# Patient Record
Sex: Female | Born: 1955 | Race: White | Hispanic: No | Marital: Married | Smoking: Former smoker
Health system: Southern US, Community
[De-identification: ages and names within clinical notes are randomized; demographics above are authoritative.]

## PROBLEM LIST (undated history)

## (undated) DIAGNOSIS — M1990 Unspecified osteoarthritis, unspecified site: Secondary | ICD-10-CM

## (undated) DIAGNOSIS — E871 Hypo-osmolality and hyponatremia: Secondary | ICD-10-CM

## (undated) DIAGNOSIS — I10 Essential (primary) hypertension: Secondary | ICD-10-CM

## (undated) DIAGNOSIS — E538 Deficiency of other specified B group vitamins: Secondary | ICD-10-CM

## (undated) DIAGNOSIS — I6529 Occlusion and stenosis of unspecified carotid artery: Secondary | ICD-10-CM

## (undated) DIAGNOSIS — I739 Peripheral vascular disease, unspecified: Secondary | ICD-10-CM

## (undated) DIAGNOSIS — K219 Gastro-esophageal reflux disease without esophagitis: Secondary | ICD-10-CM

## (undated) DIAGNOSIS — R0600 Dyspnea, unspecified: Secondary | ICD-10-CM

## (undated) DIAGNOSIS — D751 Secondary polycythemia: Secondary | ICD-10-CM

## (undated) DIAGNOSIS — Z87891 Personal history of nicotine dependence: Secondary | ICD-10-CM

## (undated) DIAGNOSIS — E785 Hyperlipidemia, unspecified: Secondary | ICD-10-CM

## (undated) DIAGNOSIS — M810 Age-related osteoporosis without current pathological fracture: Secondary | ICD-10-CM

## (undated) HISTORY — DX: Occlusion and stenosis of unspecified carotid artery: I65.29

## (undated) HISTORY — DX: Essential (primary) hypertension: I10

## (undated) HISTORY — DX: Personal history of nicotine dependence: Z87.891

## (undated) HISTORY — DX: Unspecified osteoarthritis, unspecified site: M19.90

## (undated) HISTORY — DX: Hyperlipidemia, unspecified: E78.5

## (undated) HISTORY — DX: Hypo-osmolality and hyponatremia: E87.1

## (undated) HISTORY — DX: Secondary polycythemia: D75.1

## (undated) HISTORY — DX: Deficiency of other specified B group vitamins: E53.8

## (undated) HISTORY — DX: Age-related osteoporosis without current pathological fracture: M81.0

## (undated) HISTORY — DX: Gastro-esophageal reflux disease without esophagitis: K21.9

---

## 2000-07-25 ENCOUNTER — Other Ambulatory Visit: Admission: RE | Admit: 2000-07-25 | Discharge: 2000-07-25 | Payer: Self-pay | Admitting: *Deleted

## 2012-04-21 ENCOUNTER — Emergency Department: Payer: Self-pay | Admitting: Internal Medicine

## 2017-12-28 DIAGNOSIS — M130 Polyarthritis, unspecified: Secondary | ICD-10-CM | POA: Diagnosis not present

## 2017-12-28 DIAGNOSIS — I10 Essential (primary) hypertension: Secondary | ICD-10-CM | POA: Diagnosis not present

## 2017-12-28 DIAGNOSIS — D751 Secondary polycythemia: Secondary | ICD-10-CM | POA: Diagnosis not present

## 2017-12-28 DIAGNOSIS — F17200 Nicotine dependence, unspecified, uncomplicated: Secondary | ICD-10-CM | POA: Diagnosis not present

## 2018-04-01 DIAGNOSIS — I6523 Occlusion and stenosis of bilateral carotid arteries: Secondary | ICD-10-CM | POA: Diagnosis not present

## 2018-04-01 DIAGNOSIS — I10 Essential (primary) hypertension: Secondary | ICD-10-CM | POA: Diagnosis not present

## 2018-04-01 DIAGNOSIS — Z23 Encounter for immunization: Secondary | ICD-10-CM | POA: Diagnosis not present

## 2018-04-01 DIAGNOSIS — S8012XA Contusion of left lower leg, initial encounter: Secondary | ICD-10-CM | POA: Diagnosis not present

## 2018-04-01 DIAGNOSIS — M810 Age-related osteoporosis without current pathological fracture: Secondary | ICD-10-CM | POA: Diagnosis not present

## 2018-07-05 DIAGNOSIS — M130 Polyarthritis, unspecified: Secondary | ICD-10-CM | POA: Diagnosis not present

## 2018-07-05 DIAGNOSIS — D751 Secondary polycythemia: Secondary | ICD-10-CM | POA: Diagnosis not present

## 2018-07-05 DIAGNOSIS — I10 Essential (primary) hypertension: Secondary | ICD-10-CM | POA: Diagnosis not present

## 2018-07-05 DIAGNOSIS — F17200 Nicotine dependence, unspecified, uncomplicated: Secondary | ICD-10-CM | POA: Diagnosis not present

## 2019-02-06 DIAGNOSIS — M130 Polyarthritis, unspecified: Secondary | ICD-10-CM | POA: Diagnosis not present

## 2019-02-06 DIAGNOSIS — Z1331 Encounter for screening for depression: Secondary | ICD-10-CM | POA: Diagnosis not present

## 2019-02-06 DIAGNOSIS — Z87891 Personal history of nicotine dependence: Secondary | ICD-10-CM | POA: Diagnosis not present

## 2019-02-06 DIAGNOSIS — D751 Secondary polycythemia: Secondary | ICD-10-CM | POA: Diagnosis not present

## 2019-02-06 DIAGNOSIS — Z23 Encounter for immunization: Secondary | ICD-10-CM | POA: Diagnosis not present

## 2019-02-06 DIAGNOSIS — I10 Essential (primary) hypertension: Secondary | ICD-10-CM | POA: Diagnosis not present

## 2019-03-14 DIAGNOSIS — I10 Essential (primary) hypertension: Secondary | ICD-10-CM | POA: Diagnosis not present

## 2019-03-14 DIAGNOSIS — Z1231 Encounter for screening mammogram for malignant neoplasm of breast: Secondary | ICD-10-CM | POA: Diagnosis not present

## 2019-03-14 DIAGNOSIS — Z6821 Body mass index (BMI) 21.0-21.9, adult: Secondary | ICD-10-CM | POA: Diagnosis not present

## 2019-03-14 DIAGNOSIS — E871 Hypo-osmolality and hyponatremia: Secondary | ICD-10-CM | POA: Diagnosis not present

## 2019-04-17 DIAGNOSIS — I10 Essential (primary) hypertension: Secondary | ICD-10-CM | POA: Diagnosis not present

## 2019-04-17 DIAGNOSIS — Z6821 Body mass index (BMI) 21.0-21.9, adult: Secondary | ICD-10-CM | POA: Diagnosis not present

## 2019-04-17 DIAGNOSIS — E871 Hypo-osmolality and hyponatremia: Secondary | ICD-10-CM | POA: Diagnosis not present

## 2019-10-24 DIAGNOSIS — M130 Polyarthritis, unspecified: Secondary | ICD-10-CM | POA: Diagnosis not present

## 2019-10-24 DIAGNOSIS — D751 Secondary polycythemia: Secondary | ICD-10-CM | POA: Diagnosis not present

## 2019-10-24 DIAGNOSIS — Z87891 Personal history of nicotine dependence: Secondary | ICD-10-CM | POA: Diagnosis not present

## 2019-10-24 DIAGNOSIS — I10 Essential (primary) hypertension: Secondary | ICD-10-CM | POA: Diagnosis not present

## 2019-11-13 DIAGNOSIS — Z1231 Encounter for screening mammogram for malignant neoplasm of breast: Secondary | ICD-10-CM | POA: Diagnosis not present

## 2019-11-27 DIAGNOSIS — I10 Essential (primary) hypertension: Secondary | ICD-10-CM | POA: Diagnosis not present

## 2019-11-27 DIAGNOSIS — Z6820 Body mass index (BMI) 20.0-20.9, adult: Secondary | ICD-10-CM | POA: Diagnosis not present

## 2019-11-27 DIAGNOSIS — E871 Hypo-osmolality and hyponatremia: Secondary | ICD-10-CM | POA: Diagnosis not present

## 2019-11-27 DIAGNOSIS — B070 Plantar wart: Secondary | ICD-10-CM | POA: Diagnosis not present

## 2020-02-27 DIAGNOSIS — E871 Hypo-osmolality and hyponatremia: Secondary | ICD-10-CM | POA: Diagnosis not present

## 2020-02-27 DIAGNOSIS — Z23 Encounter for immunization: Secondary | ICD-10-CM | POA: Diagnosis not present

## 2020-02-27 DIAGNOSIS — I10 Essential (primary) hypertension: Secondary | ICD-10-CM | POA: Diagnosis not present

## 2020-02-27 DIAGNOSIS — Z1331 Encounter for screening for depression: Secondary | ICD-10-CM | POA: Diagnosis not present

## 2020-02-27 DIAGNOSIS — D751 Secondary polycythemia: Secondary | ICD-10-CM | POA: Diagnosis not present

## 2020-02-27 DIAGNOSIS — M130 Polyarthritis, unspecified: Secondary | ICD-10-CM | POA: Diagnosis not present

## 2021-03-30 ENCOUNTER — Encounter: Payer: Self-pay | Admitting: *Deleted

## 2021-04-06 ENCOUNTER — Other Ambulatory Visit: Payer: Self-pay

## 2021-04-06 ENCOUNTER — Encounter: Payer: Self-pay | Admitting: Vascular Surgery

## 2021-04-06 ENCOUNTER — Ambulatory Visit (INDEPENDENT_AMBULATORY_CARE_PROVIDER_SITE_OTHER): Payer: BC Managed Care – PPO | Admitting: Vascular Surgery

## 2021-04-06 VITALS — BP 138/69 | HR 95 | Temp 98.5°F | Resp 20 | Ht 62.0 in | Wt 133.0 lb

## 2021-04-06 DIAGNOSIS — I739 Peripheral vascular disease, unspecified: Secondary | ICD-10-CM | POA: Diagnosis not present

## 2021-04-06 NOTE — Progress Notes (Signed)
Patient ID: Kathryn Fowler, female   DOB: 1956/03/06, 65 y.o.   MRN: [id number]  Reason for Consult: New Patient (Initial Visit)   Referred by Lonie Peak, PA-C  Subjective:     HPI:  Kathryn Fowler is a 65 y.o. female without history of vascular disease.  She states that she had screening test which demonstrated carotid artery stenosis in the past.  She is a longtime daily smoker.  She also has hyperlipidemia hypertension.  She denies any history of stroke, TIA or amaurosis.  No personal family history of aneurysm disease.  She presents for bilateral lower extremity left greater than right pain which is present with any amount of walking.  She has very short distance claudication and states that she cannot walk to the front of our office from the current room that she is in.  She also has pain in the middle the night which wakes her from sleeping which is characteristically on the forefoot mostly on the left side sometimes the right.  She denies any tissue loss or ulceration.  She does take cilostazol as previously prescribed she takes no antiplatelets or statins.  Past Medical History:  Diagnosis Date   Arthritis    B12 deficiency    Carotid artery occlusion    Esophageal reflux    History of tobacco abuse    Hyperlipidemia    Hypertension    Hyponatremia    Osteoporosis    Polycythemia    Family History  Problem Relation Age of Onset   Cancer Mother    History reviewed. No pertinent surgical history.  Short Social History:  Social History   Tobacco Use   Smoking status: Every Day    Packs/day: 0.50    Types: Cigarettes   Smokeless tobacco: Not on file  Substance Use Topics   Alcohol use: Not on file    No Known Allergies  Current Outpatient Medications  Medication Sig Dispense Refill   cilostazol (PLETAL) 100 MG tablet Take 100 mg by mouth 2 (two) times daily.     ibuprofen (ADVIL) 800 MG tablet Take 800 mg by mouth every 8 (eight) hours as needed.      losartan (COZAAR) 100 MG tablet Take 100 mg by mouth daily.     metoprolol succinate (TOPROL-XL) 100 MG 24 hr tablet Take 100 mg by mouth daily. Take with or immediately following a meal.     No current facility-administered medications for this visit.    Review of Systems  Constitutional:  Constitutional negative. HENT: HENT negative.  Eyes: Eyes negative.  Cardiovascular: Positive for claudication.  GI: Gastrointestinal negative.  Musculoskeletal: Positive for leg pain.  Skin: Skin negative.  Neurological: Neurological negative. Hematologic: Hematologic/lymphatic negative.  Psychiatric: Psychiatric negative.       Objective:  Objective   Vitals:   04/06/21 1431  BP: 138/69  Pulse: 95  Resp: 20  Temp: 98.5 F (36.9 C)  SpO2: 92%  Weight: 133 lb (60.3 kg)  Height: 5\' 2"  (1.575 m)   Body mass index is 24.33 kg/m.  Physical Exam HENT:     Head: Normocephalic.     Nose:     Comments: Wearing a mask Eyes:     Pupils: Pupils are equal, round, and reactive to light.  Neck:     Vascular: Carotid bruit present.  Cardiovascular:     Pulses:          Radial pulses are 2+ on the right side and 2+ on the  left side.       Femoral pulses are 0 on the right side and 0 on the left side.    Comments: Right carotid bruit Pulmonary:     Effort: Pulmonary effort is normal.  Abdominal:     General: Abdomen is flat.     Palpations: Abdomen is soft.  Musculoskeletal:     Comments: Multiple areas of telangiectasias on the left medial thigh  Skin:    General: Skin is warm and dry.     Capillary Refill: Capillary refill takes less than 2 seconds.  Neurological:     General: No focal deficit present.     Mental Status: She is alert.  Psychiatric:        Mood and Affect: Mood normal.        Behavior: Behavior normal.        Thought Content: Thought content normal.        Judgment: Judgment normal.    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  This is likely due to hypercholesterolemia, hypertension and mostly due to smoking.  We discussed the urgent need for smoking cessation.  She will begin to take 81 mg of aspirin daily.  Given the lack of femoral pulses I will order CT angio abdomen pelvis with bilateral lower extremity runoff and given her carotid bruit on the right we will also get a duplex of her carotid arteries.  I will have her follow-up after the studies to discuss options for intervention.  All of her questions were answered satisfactorily today.    Maeola Harman MD [facility]

## 2021-04-07 ENCOUNTER — Other Ambulatory Visit: Payer: Self-pay

## 2021-04-07 DIAGNOSIS — R0989 Other specified symptoms and signs involving the circulatory and respiratory systems: Secondary | ICD-10-CM

## 2021-04-11 ENCOUNTER — Other Ambulatory Visit: Payer: Self-pay

## 2021-04-11 DIAGNOSIS — I739 Peripheral vascular disease, unspecified: Secondary | ICD-10-CM

## 2021-04-14 ENCOUNTER — Other Ambulatory Visit: Payer: Self-pay

## 2021-04-14 ENCOUNTER — Encounter (HOSPITAL_BASED_OUTPATIENT_CLINIC_OR_DEPARTMENT_OTHER): Payer: Self-pay

## 2021-04-14 ENCOUNTER — Ambulatory Visit (HOSPITAL_BASED_OUTPATIENT_CLINIC_OR_DEPARTMENT_OTHER)
Admission: RE | Admit: 2021-04-14 | Discharge: 2021-04-14 | Disposition: A | Discharge status: Home / Self Care | Payer: BC Managed Care – PPO | Source: Ambulatory Visit | Attending: Vascular Surgery | Admitting: Vascular Surgery

## 2021-04-14 DIAGNOSIS — I739 Peripheral vascular disease, unspecified: Secondary | ICD-10-CM | POA: Diagnosis present

## 2021-04-14 LAB — POCT I-STAT CREATININE: Creatinine, Ser: 0.8 mg/dL (ref 0.44–1.00)

## 2021-04-14 MED ORDER — IOHEXOL 350 MG/ML SOLN
100.0000 mL | Freq: Once | INTRAVENOUS | Status: AC | PRN
  Administered 2021-04-14: 100 mL via INTRAVENOUS

## 2021-04-20 ENCOUNTER — Encounter: Payer: Self-pay | Admitting: Vascular Surgery

## 2021-04-20 ENCOUNTER — Ambulatory Visit (HOSPITAL_COMMUNITY)
Admission: RE | Admit: 2021-04-20 | Discharge: 2021-04-20 | Disposition: A | Discharge status: Home / Self Care | Payer: BC Managed Care – PPO | Source: Ambulatory Visit | Attending: Vascular Surgery | Admitting: Vascular Surgery

## 2021-04-20 ENCOUNTER — Other Ambulatory Visit: Payer: Self-pay

## 2021-04-20 ENCOUNTER — Ambulatory Visit: Payer: BC Managed Care – PPO | Admitting: Vascular Surgery

## 2021-04-20 VITALS — BP 177/72 | HR 90 | Temp 98.5°F | Resp 20 | Ht 62.0 in | Wt 133.0 lb

## 2021-04-20 DIAGNOSIS — R0989 Other specified symptoms and signs involving the circulatory and respiratory systems: Secondary | ICD-10-CM | POA: Insufficient documentation

## 2021-04-20 DIAGNOSIS — I739 Peripheral vascular disease, unspecified: Secondary | ICD-10-CM | POA: Diagnosis not present

## 2021-04-20 MED ORDER — OXYCODONE-ACETAMINOPHEN 5-325 MG PO TABS: 1.0000 | ORAL_TABLET | ORAL | 0 refills | Status: DC | PRN

## 2021-04-20 NOTE — Progress Notes (Signed)
Patient ID: Kathryn Fowler, female   DOB: [birth year], 65 y.o.   MRN: [id number]  Reason for Consult: Follow-up   Referred by No ref. provider found  Subjective:     HPI:  Kathryn Fowler is a 65 y.o. female initially saw for bilateral lower extremity left greater than right rest pain.  She continues to smoke due to significant pain particularly at night.  She does not have any tissue loss or ulceration.  She states that she is only able to walk a very short distance due to claudication.  She does not have any history of stroke, TIA or amaurosis and no personal family history of aneurysm disease.  She takes cilostazol and has begun taking aspirin since my last visit.  She states that her pain in the middle the night is so unbearable that she can no longer sleep.  Past Medical History:  Diagnosis Date   Arthritis    B12 deficiency    Carotid artery occlusion    Esophageal reflux    History of tobacco abuse    Hyperlipidemia    Hypertension    Hyponatremia    Osteoporosis    Polycythemia    Family History  Problem Relation Age of Onset   Cancer Mother    History reviewed. No pertinent surgical history.  Short Social History:  Social History   Tobacco Use   Smoking status: Every Day    Packs/day: 0.25    Types: Cigarettes   Smokeless tobacco: Not on file  Substance Use Topics   Alcohol use: Not on file    No Known Allergies  Current Outpatient Medications  Medication Sig Dispense Refill   cilostazol (PLETAL) 100 MG tablet Take 100 mg by mouth 2 (two) times daily.     ibuprofen (ADVIL) 800 MG tablet Take 800 mg by mouth every 8 (eight) hours as needed.     losartan (COZAAR) 100 MG tablet Take 100 mg by mouth daily.     metoprolol succinate (TOPROL-XL) 100 MG 24 hr tablet Take 100 mg by mouth daily. Take with or immediately following a meal.     oxyCODONE-acetaminophen (PERCOCET/ROXICET) 5-325 MG tablet Take 1 tablet by mouth every 4 (four) hours as needed for severe  pain. 30 tablet 0   No current facility-administered medications for this visit.    Review of Systems  Constitutional:  Constitutional negative. HENT: HENT negative.  Eyes: Eyes negative.  Respiratory: Respiratory negative.  Cardiovascular: Cardiovascular negative.  GI: Gastrointestinal negative.  Musculoskeletal: Positive for leg pain.  Skin: Skin negative.  Neurological: Neurological negative. Hematologic: Hematologic/lymphatic negative.  Psychiatric: Psychiatric negative.       Objective:  Objective   Vitals:   04/20/21 1520  BP: (!) 177/72  Pulse: 90  Resp: 20  Temp: 98.5 F (36.9 C)  SpO2: 93%  Weight: 133 lb (60.3 kg)  Height:  (1.575 m)   Body mass index is 24.33 kg/m.  Physical Exam HENT:     Head: Normocephalic.     Nose:     Comments: Wearing a mask Eyes:     Pupils: Pupils are equal, round, and reactive to light.  Cardiovascular:     Pulses:          Femoral pulses are 0 on the right side and 0 on the left side.      Popliteal pulses are 0 on the right side and 0 on the left side.  Pulmonary:     Effort: Pulmonary effort  is normal.  Abdominal:     General: Abdomen is flat.     Palpations: Abdomen is soft. There is no mass.  Musculoskeletal:     Cervical back: Normal range of motion.     Right lower leg: No edema.     Left lower leg: No edema.  Neurological:     General: No focal deficit present.     Mental Status: She is alert.  Psychiatric:        Mood and Affect: Mood normal.        Behavior: Behavior normal.    Data: CTA IMPRESSION: 1. Bilateral proximal common iliac artery stenoses of possible hemodynamic significance. 2. Distal RIGHT SFA and popliteal tandem stenoses, with diseased 2 vessel tibial runoff. 3. LEFT popliteal artery occlusive disease with limited evaluation of distal tibial runoff 4.  Aortic Atherosclerosis (ICD10-170.0). 5. Small hiatal hernia       Right Carotid Findings:   +----------+--------+--------+--------+-------------------------+---------+               PSV cm/s EDV cm/s Stenosis Plaque Description        Comments     +----------+--------+--------+--------+-------------------------+---------+    CCA Prox   151      12                                                       +----------+--------+--------+--------+-------------------------+---------+    CCA Mid    159      20                heterogenous and  calcific Shadowing   +----------+--------+--------+--------+-------------------------+---------+    CCA Distal 154      17                heterogenous and  calcific Shadowing   +----------+--------+--------+--------+-------------------------+---------+    ICA Prox   94       15       1-39%    heterogenous and calcific              +----------+--------+--------+--------+-------------------------+---------+    ICA Mid    118      23                                                       +----------+--------+--------+--------+-------------------------+---------+    ICA Distal 116      21                                                       +----------+--------+--------+--------+-------------------------+---------+    ECA        173      8                 heterogenous and calcific              +----------+--------+--------+--------+-------------------------+---------+    +----------+--------+-------+----------------+-------------------+              PSV cm/s EDV cms Describe         Arm  Pressure (mmHG)   +----------+--------+-------+----------------+-------------------+   Subclavian 223              Multiphasic, WNL                       +----------+--------+-------+----------------+-------------------+   +---------+--------+--+--------+--+---------+   Vertebral PSV cm/s 88 EDV cm/s 12 Antegrade   +---------+--------+--+--------+--+---------+       Left Carotid Findings:   +----------+--------+--------+--------+------------------+--------+              PSV cm/s EDV cm/s Stenosis Plaque Description Comments   +----------+--------+--------+--------+------------------+--------+   CCA Prox   132      15                                              +----------+--------+--------+--------+------------------+--------+   CCA Mid    136      17                heterogenous                  +----------+--------+--------+--------+------------------+--------+   CCA Distal 137      17                heterogenous                  +----------+--------+--------+--------+------------------+--------+   ICA Prox   78       22       1-39%    heterogenous                  +----------+--------+--------+--------+------------------+--------+   ICA Mid    80       21                                              +----------+--------+--------+--------+------------------+--------+   ICA Distal 80       23                                   tortuous   +----------+--------+--------+--------+------------------+--------+   ECA        178      3                 heterogenous                  +----------+--------+--------+--------+------------------+--------+   +----------+--------+--------+----------------+-------------------+              PSV cm/s EDV cm/s Describe         Arm Pressure (mmHG)   +----------+--------+--------+----------------+-------------------+   Subclavian 265               Multiphasic, WNL                       +----------+--------+--------+----------------+-------------------+   +---------+--------+--+--------+-+---------+   Vertebral PSV cm/s 74 EDV cm/s 9 Antegrade   +---------+--------+--+--------+-+---------+       Summary:  Right Carotid: Velocities in the right ICA are consistent with a 1-39%  stenosis.                 Non-hemodynamically significant plaque <50% noted in the  CCA.  Left Carotid: Velocities in the left ICA are consistent with a 1-39%   stenosis.   Vertebrals:  Bilateral vertebral arteries demonstrate antegrade flow.  Subclavians: Normal flow hemodynamics were seen in bilateral subclavian               arteries.  Assessment/Plan:     65 year old female with significant aortoiliac occlusive disease as well as common femoral disease and bilateral SFA popliteal disease as well.  She has significantly depressed ABIs left greater than right from previous evaluation and we reviewed her CT scan today which demonstrates heavily calcified aortic bifurcation disease.  I discussed with the patient that given her high degree of symptoms as well as her concomitant common femoral and aortoiliac disease she could be a stenting candidate this may not be successful and would likely be short-term fix and ultimately would likely require aortobifemoral bypass.  Patient is hopeful to get this done now and does not want any temporary fixes.  She has never seen cardiology we will get her evaluated with them.  She will continue aspirin.  I have sent 20 Percocet to her pharmacy to get her through the night until her surgery is scheduled.  I discussed the risk and benefits of aortobifemoral bypass.  Specifically I discussed the risk of death, the risk of a coronary event possibly requiring intervention, the risk of pulmonary insufficiency requiring prolonged intubation, the risk of intra-abdominal complications which could lead to small bowel or colon resection, possible risk of kidney failure up to and including dialysis and likelihood of blood transfusion.  Patient at this time has significant rest pain is hopeful to get surgery demonstrates good understanding in the presence of her husband.     Maeola Harman MD [facility]

## 2021-05-03 ENCOUNTER — Other Ambulatory Visit: Payer: Self-pay | Admitting: Physician Assistant

## 2021-05-03 MED ORDER — OXYCODONE-ACETAMINOPHEN 5-325 MG PO TABS: 1.0000 | ORAL_TABLET | Freq: Four times a day (QID) | ORAL | 0 refills | Status: DC | PRN

## 2021-05-03 NOTE — Progress Notes (Signed)
Pt calling to request pain medication as she cannot get in to cardiology for surgical clearance until 05/23/2021.  Will give Percocet 5/325 one po q6h prn pain #20 no refill for her rest pain but will not be able to get another pain prescription until after surgery.  PDMP reviewed.  Doreatha Massed, [facility] 05/03/2021 2:04 PM

## 2021-05-04 ENCOUNTER — Telehealth: Payer: Self-pay

## 2021-05-04 NOTE — Telephone Encounter (Signed)
Patient called requesting a letter to provide to her job stating she will need to be sitting during her shift due to her leg pains. I advised the letter will have to come from [facility] but Dr. Randie Heinz is out of the office this week. Patient stated she needs the work note today, I reiterated that I was unable to provide the letter due to Dr. Randie Heinz being out of the office. I also advised that I will contact him next week to discuss the letter and will call her next week after I speak with him. Patient voiced her understanding.

## 2021-05-04 NOTE — Telephone Encounter (Signed)
Patient calls today to report swelling in her legs and a weight gain of 30 pounds in 2 weeks.  She is crying and would like "something to help her." Advised that we knew she needed surgery and would schedule it after she had the soonest appt with cardiology. Pain meds sent in yesterday. Asked her to call PCP - she said her doctor is out until next week. Advised her there were other doctors at the PCP office and patient states, "what am I gonna tell them, that I hurt and I need something?" Instructed patient she would need to report to the ED if she could not wait. Patient stated, "I'm not going there - its a waste of my money and they let me sister sit there for 4 hours until she died." Patient would also like a letter for work. Jetta aware and will call patient back.

## 2021-05-23 ENCOUNTER — Encounter: Payer: Self-pay | Admitting: *Deleted

## 2021-05-23 ENCOUNTER — Other Ambulatory Visit: Payer: Self-pay

## 2021-05-23 ENCOUNTER — Ambulatory Visit: Payer: BC Managed Care – PPO | Admitting: Cardiovascular Disease

## 2021-05-23 ENCOUNTER — Encounter: Payer: Self-pay | Admitting: Cardiovascular Disease

## 2021-05-23 VITALS — BP 132/66 | HR 97 | Ht 62.0 in | Wt 129.6 lb

## 2021-05-23 DIAGNOSIS — R079 Chest pain, unspecified: Secondary | ICD-10-CM

## 2021-05-23 DIAGNOSIS — I739 Peripheral vascular disease, unspecified: Secondary | ICD-10-CM

## 2021-05-23 DIAGNOSIS — R011 Cardiac murmur, unspecified: Secondary | ICD-10-CM

## 2021-05-23 NOTE — Progress Notes (Signed)
Cardiology Office Note:    Date:  05/23/2021   ID:  [name], DOB [birth year], MRN [id number]  PCP:  Lonie Peak, PA-C   [facility] Providers Cardiologist:    Vihan Santagata     Referring MD: Lonie Peak, PA-C   Chief Complaint  Patient presents with   carotid artery disease    History of Present Illness:    Kathryn Fowler is a 66 y.o. 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 No pertinent surgical history.  Current Medications: Current Meds  Medication Sig   cilostazol (PLETAL) 100 MG tablet Take 100 mg by mouth 2 (two) times daily.   furosemide (LASIX) 20 MG tablet Take 20 mg by mouth daily.   ibuprofen (ADVIL) 800 MG tablet Take 800 mg by mouth every 8 (eight) hours as needed.   losartan (COZAAR) 100 MG tablet Take 100 mg by mouth daily.   metoprolol succinate (TOPROL-XL) 100 MG 24 hr tablet Take 100 mg by mouth daily. Take with or immediately following a meal.   oxyCODONE-acetaminophen (PERCOCET/ROXICET) 5-325 MG tablet Take 1 tablet by mouth every 6 (six) hours as needed for severe pain.     Allergies:   Patient has no known allergies.   Social History   Socioeconomic History   Marital status: Married    Spouse name: Not on file   Number of children: Not on file   Years of education: Not on file   Highest education level: Not on file  Occupational History   Not on file  Tobacco Use   Smoking status: Every Day    Packs/day: 0.25    Types: Cigarettes   Smokeless  tobacco: Not on file  Vaping Use   Vaping Use: Never used  Substance and Sexual Activity   Alcohol use: Not on file   Drug use: Never   Sexual activity: Not on file  Other Topics Concern   Not on file  Social History Narrative   Not on file   Social Determinants of Health   Financial Resource Strain: Not on file  Food Insecurity: Not on file  Transportation Needs: Not on file  Physical Activity: Not on file  Stress: Not on file  Social Connections: Not on file     Family History: The patient's family history includes Cancer in her mother.  ROS:   Please see the history of present illness.     All other systems reviewed and are negative.  EKGs/Labs/Other Studies Reviewed:    The following studies were reviewed today:   EKG:   Jan. 16, 2023 :  NSR  at 97.   INc. RBBB   Recent Labs: 04/14/2021: Creatinine, Ser 0.80  Recent Lipid Panel No results found for: CHOL, TRIG, HDL, CHOLHDL, VLDL, LDLCALC, LDLDIRECT   Risk Assessment/Calculations:           Physical Exam:  VS:  BP 132/66 (BP Location: Left Arm, Patient Position: Sitting, Cuff Size: Normal)    Pulse 97    Ht 5\' 2"  (1.575 m)    Wt 129 lb 9.6 oz (58.8 kg)    SpO2 96%    BMI 23.70 kg/m     Wt Readings from Last 3 Encounters:  05/23/21 129 lb 9.6 oz (58.8 kg)  04/20/21 133 lb (60.3 kg)  04/06/21 133 lb (60.3 kg)     GEN:  Well nourished, well developed in no acute distress HEENT: Normal NECK: No JVD; No carotid bruits LYMPHATICS: No lymphadenopathy CARDIAC: RRR, n soft systolic murmur  RESPIRATORY:  Clear to auscultation without rales, wheezing or rhonchi  ABDOMEN: Soft, non-tender, non-distended MUSCULOSKELETAL:  No edema; No deformity  SKIN: Warm and dry NEUROLOGIC:  Alert and oriented x 3 PSYCHIATRIC:  Normal affect   ASSESSMENT:    1. PAD (peripheral artery disease) (HCC)   2. Murmur    PLAN:       Peripheral arterial disease: She presents with for preoperative evaluation prior to  having peripheral artery surgery.  She has never had any episodes of chest pain or shortness of breath but is scheduled to have a large intra-abdominal procedure.  We will schedule her for a Lexiscan Myoview study for further evaluation. I have encouraged her to stop smoking completely .   I'm not convinced that she will .  2.  Systolic murmur:   will get an echo for further evaluation of her cardiac murmurm      Shared Decision Making/Informed Consent{  The risks [chest pain, shortness of breath, cardiac arrhythmias, dizziness, blood pressure fluctuations, myocardial infarction, stroke/transient ischemic attack, nausea, vomiting, allergic reaction, radiation exposure, metallic taste sensation and life-threatening complications (estimated to be 1 in 10,000)], benefits (risk stratification, diagnosing coronary artery disease, treatment guidance) and alternatives of a nuclear stress test were discussed in detail with Ms. Mcneece and she agrees to proceed.    Medication Adjustments/Labs and Tests Ordered: Current medicines are reviewed at length with the patient today.  Concerns regarding medicines are outlined above.  Orders Placed This Encounter  Procedures   Myocardial Perfusion Imaging   EKG 12-Lead   ECHOCARDIOGRAM COMPLETE   No orders of the defined types were placed in this encounter.    Patient Instructions  Medication Instructions:  Your physician recommends that you continue on your current medications as directed. Please refer to the Current Medication list given to you today.  *If you need a refill on your cardiac medications before your next appointment, please call your pharmacy*   Lab Work: None ordered If you have labs (blood work) drawn today and your tests are completely normal, you will receive your results only by: MyChart Message (if you have MyChart) OR A paper copy in the mail If you have any lab test that is abnormal or we need to change your treatment, we  will call you to review the results.   Testing/Procedures: Your physician has requested that you have an echocardiogram. Echocardiography is a painless test that uses sound waves to create images of your heart. It provides your doctor with information about the size and shape of your heart and how well your hearts chambers and valves are working. This procedure takes approximately one hour. There are no restrictions for this procedure.   Your physician has requested that you have a lexiscan myoview. For further information please visit Ranae Pila. Please follow instruction sheet, as given.   Follow-Up: At  [facility], you and your health needs are our priority.  As part of our continuing mission to provide you with exceptional heart care, we have created designated Provider Care Teams.  These Care Teams include your primary Cardiologist (physician) and Advanced Practice Providers (APPs -  Physician Assistants and Nurse Practitioners) who all work together to provide you with the care you need, when you need it.  Your next appointment:   1 year(s)  The format for your next appointment:   In Person  Provider:   PA/NP on Dr. Harvie BridgeNahser's care team    Thank you for choosing [facility]!!   [phone]   Other Instructions       Signed, Kristeen MissPhilip Michial Disney, MD  05/23/2021 2:38 PM    [facility]

## 2021-05-23 NOTE — Patient Instructions (Addendum)
Medication Instructions:  Your physician recommends that you continue on your current medications as directed. Please refer to the Current Medication list given to you today.  *If you need a refill on your cardiac medications before your next appointment, please call your pharmacy*   Lab Work: None ordered If you have labs (blood work) drawn today and your tests are completely normal, you will receive your results only by: MyChart Message (if you have MyChart) OR A paper copy in the mail If you have any lab test that is abnormal or we need to change your treatment, we will call you to review the results.   Testing/Procedures: Your physician has requested that you have an echocardiogram. Echocardiography is a painless test that uses sound waves to create images of your heart. It provides your doctor with information about the size and shape of your heart and how well your hearts chambers and valves are working. This procedure takes approximately one hour. There are no restrictions for this procedure.   Your physician has requested that you have a lexiscan myoview. For further information please visit [email or web address] Please follow instruction sheet, as given.   Follow-Up: At [facility], you and your health needs are our priority.  As part of our continuing mission to provide you with exceptional heart care, we have created designated Provider Care Teams.  These Care Teams include your primary Cardiologist (physician) and Advanced Practice Providers (APPs -  Physician Assistants and Nurse Practitioners) who all work together to provide you with the care you need, when you need it.  Your next appointment:   1 year(s)  The format for your next appointment:   In Person  Provider:   PA/NP on Dr. Harvie Bridge care team    Thank you for choosing [facility]!!   [phone]   Other Instructions

## 2021-05-24 NOTE — Addendum Note (Signed)
Addended by: Vesta Mixer on: 05/24/2021 01:45 PM   Modules accepted: Orders

## 2021-05-26 ENCOUNTER — Telehealth: Payer: Self-pay

## 2021-05-26 ENCOUNTER — Other Ambulatory Visit: Payer: Self-pay | Admitting: Physician Assistant

## 2021-05-26 NOTE — Telephone Encounter (Signed)
Patient is requesting pain meds for rest pain.  Per Pleas Koch, she cannot write another Rx until after surgery.  Pt says she is scheduled to have a stress test and will try to stretch out her remaining pain medicine, then go to Tylenol and Ibuprofen.  She will schedule surgery after her stress test.

## 2021-05-30 ENCOUNTER — Telehealth (HOSPITAL_COMMUNITY): Payer: Self-pay | Admitting: *Deleted

## 2021-05-30 NOTE — Telephone Encounter (Signed)
Patient given detailed instructions per Myocardial Perfusion Study Information Sheet for the test on 06/03/21 at 7:15. Patient notified to arrive 15 minutes early and that it is imperative to arrive on time for appointment to keep from having the test rescheduled.  If you need to cancel or reschedule your appointment, please call the office within 24 hours of your appointment. . Patient verbalized understanding.Daneil Dolin

## 2021-06-03 ENCOUNTER — Other Ambulatory Visit: Payer: Self-pay

## 2021-06-03 ENCOUNTER — Ambulatory Visit (HOSPITAL_BASED_OUTPATIENT_CLINIC_OR_DEPARTMENT_OTHER): Payer: BC Managed Care – PPO

## 2021-06-03 ENCOUNTER — Ambulatory Visit (HOSPITAL_COMMUNITY): Payer: BC Managed Care – PPO | Attending: Cardiology

## 2021-06-03 DIAGNOSIS — I739 Peripheral vascular disease, unspecified: Secondary | ICD-10-CM | POA: Diagnosis not present

## 2021-06-03 DIAGNOSIS — R011 Cardiac murmur, unspecified: Secondary | ICD-10-CM

## 2021-06-03 DIAGNOSIS — I5030 Unspecified diastolic (congestive) heart failure: Secondary | ICD-10-CM | POA: Diagnosis not present

## 2021-06-03 LAB — MYOCARDIAL PERFUSION IMAGING
LV dias vol: 53 mL (ref 46–106)
LV sys vol: 17 mL
Nuc Stress EF: 67 %
Peak HR: 110 {beats}/min
Rest HR: 80 {beats}/min
Rest Nuclear Isotope Dose: 10.8 mCi
SDS: 0
SRS: 0
SSS: 0
ST Depression (mm): 0 mm
Stress Nuclear Isotope Dose: 31.4 mCi
TID: 0.81

## 2021-06-03 LAB — ECHOCARDIOGRAM COMPLETE
Area-P 1/2: 3.73 cm2
S' Lateral: 2.3 cm

## 2021-06-03 MED ORDER — TECHNETIUM TC 99M TETROFOSMIN IV KIT
30.7000 | PACK | Freq: Once | INTRAVENOUS | Status: AC | PRN
  Administered 2021-06-03: 31.4 via INTRAVENOUS
  Filled 2021-06-03: qty 31

## 2021-06-03 MED ORDER — TECHNETIUM TC 99M TETROFOSMIN IV KIT
10.6000 | PACK | Freq: Once | INTRAVENOUS | Status: AC | PRN
  Administered 2021-06-03: 10.8 via INTRAVENOUS
  Filled 2021-06-03: qty 11

## 2021-06-03 MED ORDER — REGADENOSON 0.4 MG/5ML IV SOLN
0.4000 mg | Freq: Once | INTRAVENOUS | Status: AC
  Administered 2021-06-03: 0.4 mg via INTRAVENOUS

## 2021-06-06 ENCOUNTER — Other Ambulatory Visit: Payer: Self-pay

## 2021-06-06 DIAGNOSIS — I739 Peripheral vascular disease, unspecified: Secondary | ICD-10-CM

## 2021-06-07 ENCOUNTER — Telehealth: Payer: Self-pay

## 2021-06-07 NOTE — Telephone Encounter (Signed)
Pt called with questions about her upcoming surgery next month. She has not seen or spoken to MD and prefers to do so beforehand. I have given her pre op information and scheduled her to speak with MD via phone visit tomorrow morning. Pt is aware of this and verbalized understanding.

## 2021-06-08 ENCOUNTER — Ambulatory Visit (INDEPENDENT_AMBULATORY_CARE_PROVIDER_SITE_OTHER): Payer: BC Managed Care – PPO | Admitting: Vascular Surgery

## 2021-06-08 ENCOUNTER — Other Ambulatory Visit: Payer: Self-pay

## 2021-06-08 DIAGNOSIS — I739 Peripheral vascular disease, unspecified: Secondary | ICD-10-CM

## 2021-06-08 MED ORDER — OXYCODONE-ACETAMINOPHEN 5-325 MG PO TABS: 1.0000 | ORAL_TABLET | ORAL | 0 refills | Status: DC | PRN

## 2021-06-08 NOTE — H&P (View-Only) (Signed)
° °  ° ° °Virtual Visit via Telephone Note ° °I connected with Kathryn Fowler on 06/08/2021 using the [email or web address] by telephone and verified that I was speaking with the correct person using two identifiers. Patient was located at home and alone. I am located at in the office alone. °  °The limitations of evaluation and management by telemedicine and the availability of in person appointments have been previously discussed with the patient and are documented in the patients chart. The patient expressed understanding and consented to proceed. ° °Chief Complaint: Bilateral foot pain ° °History of Present Illness: °Kathryn Fowler is a 65 y.o. female with history of bilateral lower extremity foot pain.  CT scan demonstrated calcified aorta with distal occlusion.  ABIs previously in [place] were demonstrated to be 0.5 on the right and 0.36 on the left.  She has recently undergone nuclear stress testing.  She states that she still has severe pain in her feet but denies any tissue loss or ulceration.  She is taking half an oxycodone at night to help her sleep.  She otherwise continues to smoke unfortunately but is trying to cut back.  She is taking aspirin daily.  Denies any previous surgeries. ° °Past Medical History:  °Diagnosis Date  ° Arthritis   ° B12 deficiency   ° Carotid artery occlusion   ° Esophageal reflux   ° History of tobacco abuse   ° Hyperlipidemia   ° Hypertension   ° Hyponatremia   ° Osteoporosis   ° Polycythemia   ° ° °No past surgical history on file. ° °Current Meds  °Medication Sig  ° cilostazol (PLETAL) 100 MG tablet Take 100 mg by mouth 2 (two) times daily.  ° furosemide (LASIX) 20 MG tablet Take 20 mg by mouth daily.  ° ibuprofen (ADVIL) 800 MG tablet Take 800 mg by mouth every 8 (eight) hours as needed.  ° losartan (COZAAR) 100 MG tablet Take 100 mg by mouth daily.  ° metoprolol succinate (TOPROL-XL) 100 MG 24 hr tablet Take 100 mg by mouth daily. Take with or immediately following a meal.   ° oxyCODONE-acetaminophen (PERCOCET/ROXICET) 5-325 MG tablet Take 1 tablet by mouth every 6 (six) hours as needed for severe pain.  ° ° °12 system ROS was negative unless otherwise noted in HPI °  °Observations/Objective: °There were no vitals filed for this visit. 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  I again discussed the nature of the procedure being aortobifemoral bypass.  She does have risk of coronary event but has low risk stress test.  She also has risk for prolonged intubation given underlying lung disease with long history of smoking.  We discussed the risk of blood loss the risk of injury to surrounding structures including to her intestines and small risk of death.  Overall she has about a 20% risk of major complication from the surgery.  She demonstrates good understanding.  We expect a 7 to 10-day hospitalization with possible rehab after this. ° °Follow Up Instructions: ° °  °I discussed the assessment and treatment plan with the patient. The patient was provided an opportunity to ask questions and all were answered. The patient agreed with the plan and demonstrated an understanding of the instructions. °  °The patient was advised to call back or seek an in-person evaluation if the symptoms worsen or   if the condition fails to improve as anticipated. ° °I spent 11 minutes with the patient via telephone encounter and in chart review. ° ° °Signed, °Kristen Fromm °[facility] °Office: [phone] ° °06/08/2021, 9:03 AM ° °

## 2021-06-08 NOTE — Progress Notes (Signed)
Virtual Visit via Telephone Note  I connected with Kathryn Fowler on 06/08/2021 using the Doxy[employer]me by telephone and verified that I was speaking with the correct person using two identifiers. Patient was located at home and alone. I am located at in the office alone.   The limitations of evaluation and management by telemedicine and the availability of in person appointments have been previously discussed with the patient and are documented in the patients chart. The patient expressed understanding and consented to proceed.  Chief Complaint: Bilateral foot pain  History of Present Illness: Kathryn Fowler is a 66 y.o. female with history of bilateral lower extremity foot pain.  CT scan demonstrated calcified aorta with distal occlusion.  ABIs previously in [facility] were demonstrated to be 0.5 on the right and 0.36 on the left.  She has recently undergone nuclear stress testing.  She states that she still has severe pain in her feet but denies any tissue loss or ulceration.  She is taking half an oxycodone at night to help her sleep.  She otherwise continues to smoke unfortunately but is trying to cut back.  She is taking aspirin daily.  Denies any previous surgeries.  Past Medical History:  Diagnosis Date   Arthritis    B12 deficiency    Carotid artery occlusion    Esophageal reflux    History of tobacco abuse    Hyperlipidemia    Hypertension    Hyponatremia    Osteoporosis    Polycythemia     No past surgical history on file.  Current Meds  Medication Sig   cilostazol (PLETAL) 100 MG tablet Take 100 mg by mouth 2 (two) times daily.   furosemide (LASIX) 20 MG tablet Take 20 mg by mouth daily.   ibuprofen (ADVIL) 800 MG tablet Take 800 mg by mouth every 8 (eight) hours as needed.   losartan (COZAAR) 100 MG tablet Take 100 mg by mouth daily.   metoprolol succinate (TOPROL-XL) 100 MG 24 hr tablet Take 100 mg by mouth daily. Take with or immediately following a meal.    oxyCODONE-acetaminophen (PERCOCET/ROXICET) 5-325 MG tablet Take 1 tablet by mouth every 6 (six) hours as needed for severe pain.    12 system ROS was negative unless otherwise noted in HPI   Observations/Objective: There were no vitals filed for this visit. 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  I again discussed the nature of the procedure being aortobifemoral bypass.  She does have risk of coronary event but has low risk stress test.  She also has risk for prolonged intubation given underlying lung disease with long history of smoking.  We discussed the risk of blood loss the risk of injury to surrounding structures including to her intestines and small risk of death.  Overall she has about a 20% risk of major complication from the surgery.  She demonstrates good understanding.  We expect a 7 to 10-day hospitalization with possible rehab after this.  Follow Up Instructions:    I discussed the assessment and treatment plan with the patient. The patient was provided an opportunity to ask questions and all were answered. The patient agreed with the plan and demonstrated an understanding of the instructions.   The patient was advised to call back or seek an in-person evaluation if the symptoms worsen or  if the condition fails to improve as anticipated.  I spent 11 minutes with the patient via telephone encounter and in chart review.   Signed, Servando Snare [facility] Office: [phone]  06/08/2021, 9:03 AM

## 2021-06-16 NOTE — Progress Notes (Signed)
Surgical Instructions    Your procedure is scheduled on 06/21/21.  Report to [facility] Main Entrance "A" at 5:30 A.M., then check in with the Admitting office.  Call this number if you have problems the morning of surgery:  [phone]   If you have any questions prior to your surgery date call [phone]: Open Monday-Friday 8am-4pm    Remember:  Do not eat or drink after midnight the night before your surgery      Take these medicines the morning of surgery with A SIP OF WATER:  metoprolol succinate (TOPROL-XL)  oxyCODONE-acetaminophen (PERCOCET/ROXICET) if needed  As of today, STOP taking any (unless otherwise instructed by your surgeon) Aleve, Naproxen, Ibuprofen, Motrin, Advil, Goody's, BC's, all herbal medications, fish oil, and all vitamins.  STOP cilostazol (PLETAL) 5 DAYS PRIOR TO SURGERY.  YOUR LAST DOSE WILL BE 06/15/21.           Do not wear jewelry or makeup Do not wear lotions, powders, perfumes, or deodorant. Do not shave 48 hours prior to surgery.   Do not bring valuables to the hospital. Do not wear nail polish, gel polish, artificial nails, or any other type of covering on natural nails (fingers and toes) If you have artificial nails or gel coating that need to be removed by a nail salon, please have this removed prior to surgery. Artificial nails or gel coating may interfere with anesthesia's ability to adequately monitor your vital signs.  [facility] is not responsible for any belongings or valuables. .   Do NOT Smoke (Tobacco/Vaping)  24 hours prior to your procedure  If you use a CPAP at night, you may bring your mask for your overnight stay.   Contacts, glasses, hearing aids, dentures or partials may not be worn into surgery, please bring cases for these belongings   For patients admitted to the hospital, discharge time will be determined by your treatment team.   Patients discharged the day of surgery will not be allowed to drive home, and someone  needs to stay with them for 24 hours.  NO VISITORS WILL BE ALLOWED IN PRE-OP WHERE PATIENTS ARE PREPPED FOR SURGERY.  ONLY 1 SUPPORT PERSON MAY BE PRESENT IN THE WAITING ROOM WHILE YOU ARE IN SURGERY.  IF YOU ARE TO BE ADMITTED, ONCE YOU ARE IN YOUR ROOM YOU WILL BE ALLOWED TWO (2) VISITORS. 1 (ONE) VISITOR MAY STAY OVERNIGHT BUT MUST ARRIVE TO THE ROOM BY 8pm.  Minor children may have two parents present. Special consideration for safety and communication needs will be reviewed on a case by case basis.  Special instructions:    Oral Hygiene is also important to reduce your risk of infection.  Remember - BRUSH YOUR TEETH THE MORNING OF SURGERY WITH YOUR REGULAR TOOTHPASTE   [place]- Preparing For Surgery  Before surgery, you can play an important role. Because skin is not sterile, your skin needs to be as free of germs as possible. You can reduce the number of germs on your skin by washing with CHG (chlorahexidine gluconate) Soap before surgery.  CHG is an antiseptic cleaner which kills germs and bonds with the skin to continue killing germs even after washing.     Please do not use if you have an allergy to CHG or antibacterial soaps. If your skin becomes reddened/irritated stop using the CHG.  Do not shave (including legs and underarms) for at least 48 hours prior to first CHG shower. It is OK to shave your face.  Please follow these instructions carefully.     Shower the NIGHT BEFORE SURGERY and the MORNING OF SURGERY with CHG Soap.   If you chose to wash your hair, wash your hair first as usual with your normal shampoo. After you shampoo, rinse your hair and body thoroughly to remove the shampoo.  Then [employer] and genitals (private parts) with your normal soap and rinse thoroughly to remove soap.  After that Use CHG Soap as you would any other liquid soap. You can apply CHG directly to the skin and wash gently with a scrungie or a clean washcloth.   Apply the CHG Soap to your body  ONLY FROM THE NECK DOWN.  Do not use on open wounds or open sores. Avoid contact with your eyes, ears, mouth and genitals (private parts). Wash Face and genitals (private parts)  with your normal soap.   Wash thoroughly, paying special attention to the area where your surgery will be performed.  Thoroughly rinse your body with warm water from the neck down.  DO NOT shower/wash with your normal soap after using and rinsing off the CHG Soap.  Pat yourself dry with a CLEAN TOWEL.  Wear CLEAN PAJAMAS to bed the night before surgery  Place CLEAN SHEETS on your bed the night before your surgery  DO NOT SLEEP WITH PETS.   Day of Surgery: Take a shower with CHG soap. Wear Clean/Comfortable clothing the morning of surgery Do not apply any deodorants/lotions.   Remember to brush your teeth WITH YOUR REGULAR TOOTHPASTE.    COVID testing  If you are going to stay overnight or be admitted after your procedure/surgery and require a pre-op COVID test, please follow these instructions after your COVID test   You are not required to quarantine however you are required to wear a well-fitting mask when you are out and around people not in your household.  If your mask becomes wet or soiled, replace with a new one.  Wash your hands often with soap and water for 20 seconds or clean your hands with an alcohol-based hand sanitizer that contains at least 60% alcohol.  Do not share personal items.  Notify your provider: if you are in close contact with someone who has COVID  or if you develop a fever of 100.4 or greater, sneezing, cough, sore throat, shortness of breath or body aches.    Please read over the following fact sheets that you were given.

## 2021-06-17 ENCOUNTER — Encounter (HOSPITAL_COMMUNITY): Payer: Self-pay

## 2021-06-17 ENCOUNTER — Other Ambulatory Visit: Payer: Self-pay

## 2021-06-17 ENCOUNTER — Encounter (HOSPITAL_COMMUNITY)
Admission: RE | Admit: 2021-06-17 | Discharge: 2021-06-17 | Disposition: A | Discharge status: Home / Self Care | Payer: BC Managed Care – PPO | Source: Ambulatory Visit | Attending: Vascular Surgery | Admitting: Vascular Surgery

## 2021-06-17 VITALS — BP 170/80 | HR 109 | Temp 98.4°F | Resp 18 | Ht 62.0 in | Wt 130.9 lb

## 2021-06-17 DIAGNOSIS — Z20822 Contact with and (suspected) exposure to covid-19: Secondary | ICD-10-CM | POA: Diagnosis not present

## 2021-06-17 DIAGNOSIS — F17210 Nicotine dependence, cigarettes, uncomplicated: Secondary | ICD-10-CM | POA: Diagnosis not present

## 2021-06-17 DIAGNOSIS — E785 Hyperlipidemia, unspecified: Secondary | ICD-10-CM | POA: Diagnosis not present

## 2021-06-17 DIAGNOSIS — R0600 Dyspnea, unspecified: Secondary | ICD-10-CM | POA: Diagnosis not present

## 2021-06-17 DIAGNOSIS — Z01812 Encounter for preprocedural laboratory examination: Secondary | ICD-10-CM | POA: Insufficient documentation

## 2021-06-17 DIAGNOSIS — Z0279 Encounter for issue of other medical certificate: Secondary | ICD-10-CM

## 2021-06-17 DIAGNOSIS — Z7902 Long term (current) use of antithrombotics/antiplatelets: Secondary | ICD-10-CM | POA: Diagnosis not present

## 2021-06-17 DIAGNOSIS — D751 Secondary polycythemia: Secondary | ICD-10-CM | POA: Insufficient documentation

## 2021-06-17 DIAGNOSIS — I10 Essential (primary) hypertension: Secondary | ICD-10-CM | POA: Diagnosis not present

## 2021-06-17 DIAGNOSIS — E871 Hypo-osmolality and hyponatremia: Secondary | ICD-10-CM | POA: Insufficient documentation

## 2021-06-17 DIAGNOSIS — J984 Other disorders of lung: Secondary | ICD-10-CM | POA: Insufficient documentation

## 2021-06-17 DIAGNOSIS — I358 Other nonrheumatic aortic valve disorders: Secondary | ICD-10-CM | POA: Insufficient documentation

## 2021-06-17 DIAGNOSIS — I2510 Atherosclerotic heart disease of native coronary artery without angina pectoris: Secondary | ICD-10-CM | POA: Diagnosis not present

## 2021-06-17 DIAGNOSIS — I739 Peripheral vascular disease, unspecified: Secondary | ICD-10-CM | POA: Diagnosis not present

## 2021-06-17 DIAGNOSIS — R011 Cardiac murmur, unspecified: Secondary | ICD-10-CM | POA: Diagnosis not present

## 2021-06-17 DIAGNOSIS — Z7982 Long term (current) use of aspirin: Secondary | ICD-10-CM | POA: Diagnosis not present

## 2021-06-17 DIAGNOSIS — Z01818 Encounter for other preprocedural examination: Secondary | ICD-10-CM

## 2021-06-17 HISTORY — DX: Dyspnea, unspecified: R06.00

## 2021-06-17 HISTORY — DX: Occlusion and stenosis of unspecified carotid artery: I65.29

## 2021-06-17 HISTORY — DX: Peripheral vascular disease, unspecified: I73.9

## 2021-06-17 LAB — COMPREHENSIVE METABOLIC PANEL
ALT: 15 U/L (ref 0–44)
AST: 18 U/L (ref 15–41)
Albumin: 3.9 g/dL (ref 3.5–5.0)
Alkaline Phosphatase: 71 U/L (ref 38–126)
Anion gap: 10 (ref 5–15)
BUN: 7 mg/dL — ABNORMAL LOW (ref 8–23)
CO2: 26 mmol/L (ref 22–32)
Calcium: 9.1 mg/dL (ref 8.9–10.3)
Chloride: 97 mmol/L — ABNORMAL LOW (ref 98–111)
Creatinine, Ser: 0.66 mg/dL (ref 0.44–1.00)
GFR, Estimated: >60 mL/min (ref >60)
Glucose, Bld: 122 mg/dL — ABNORMAL HIGH (ref 70–99)
Potassium: 4.1 mmol/L (ref 3.5–5.1)
Sodium: 133 mmol/L — ABNORMAL LOW (ref 135–145)
Total Bilirubin: 0.7 mg/dL (ref 0.3–1.2)
Total Protein: 6.8 g/dL (ref 6.5–8.1)

## 2021-06-17 LAB — PROTIME-INR
INR: 0.9 (ref 0.8–1.2)
Prothrombin Time: 12.6 seconds (ref 11.4–15.2)

## 2021-06-17 LAB — CBC
HCT: 45.6 % (ref 36.0–46.0)
Hemoglobin: 15.2 g/dL — ABNORMAL HIGH (ref 12.0–15.0)
MCH: 33 pg (ref 26.0–34.0)
MCHC: 33.3 g/dL (ref 30.0–36.0)
MCV: 98.9 fL (ref 80.0–100.0)
Platelets: 336 10*3/uL (ref 150–400)
RBC: 4.61 MIL/uL (ref 3.87–5.11)
RDW: 12.3 % (ref 11.5–15.5)
WBC: 6.1 10*3/uL (ref 4.0–10.5)
nRBC: 0 % (ref 0.0–0.2)

## 2021-06-17 LAB — SURGICAL PCR SCREEN
MRSA, PCR: NEGATIVE
Staphylococcus aureus: POSITIVE — AB

## 2021-06-17 LAB — SARS CORONAVIRUS 2 (TAT 6-24 HRS): SARS Coronavirus 2: NEGATIVE

## 2021-06-17 LAB — URINALYSIS, ROUTINE W REFLEX MICROSCOPIC
Bilirubin Urine: NEGATIVE
Glucose, UA: NEGATIVE mg/dL
Hgb urine dipstick: NEGATIVE
Ketones, ur: NEGATIVE mg/dL
Leukocytes,Ua: NEGATIVE
Nitrite: NEGATIVE
Protein, ur: NEGATIVE mg/dL
Specific Gravity, Urine: 1.013 (ref 1.005–1.030)
pH: 6 (ref 5.0–8.0)

## 2021-06-17 LAB — APTT: aPTT: 27 seconds (ref 24–36)

## 2021-06-17 NOTE — Progress Notes (Signed)
PCP - Cyndi Bender Cardiologist - Dr. Acie Fredrickson (see office note on 05/23/21)  Chest x-ray - n/a EKG - 05/23/21 Stress Test - 06/03/21 ECHO - 06/03/21   Blood Thinner Instructions: Last dose of Pletal on 06/14/21, per patient    COVID TEST- 06/17/21   Anesthesia review: yes, heart history  Patient denies shortness of breath, fever, cough and chest pain at PAT appointment   All instructions explained to the patient, with a verbal understanding of the material. Patient agrees to go over the instructions while at home for a better understanding. Patient also instructed to self quarantine after being tested for COVID-19. The opportunity to ask questions was provided.

## 2021-06-20 ENCOUNTER — Encounter (HOSPITAL_COMMUNITY): Payer: Self-pay | Admitting: Vascular Surgery

## 2021-06-20 ENCOUNTER — Encounter (HOSPITAL_COMMUNITY): Payer: Self-pay

## 2021-06-20 NOTE — Progress Notes (Signed)
Anesthesia Chart Review:  Case: [id number] Date/Time: 06/21/21 0715   Procedure: AORTOBIFEMORAL BYPASS GRAFT   Anesthesia type: General   Pre-op diagnosis: PAD   Location: [facility] OR ROOM 12 / [facility] OR   Surgeons: Maeola Harman, MD       DISCUSSION: Patient is a 66 year old female scheduled for the above procedure.  History includes smoking, HTN, HLD, polycythemia, dyspnea, carotid artery disease, PAD (with BLE claudication), hyponatremia.   She was seen by cardiologist Dr. Elease Hashimoto on 05/23/2021 for preoperative cardiology evaluation.  Denied CV symptoms but given large intra-abdominal procedure a preoperative Lexiscan Myoview study was ordered.  He also ordered an echocardiogram for evaluation of systolic murmur.  06/03/2021 stress test was nonischemic, and echo showed  Normal LV systolic function, grade 1 diastolic dysfunction, mildly dilated LA, trivial MR, aortic valve sclerosis without evidence of aortic stenosis.  She continues to smoke but has been trying to cut back.  Dr. Pascal Lux discussed that she would be at "risk for prolonged intubation given underlying lung disease with long history of smoking."  Per [facility], stop Pletal 5 days prior, continue ASA. She reported last Pletal dose 06/14/21.  06/17/2021 presurgical COVID-19 test negative.  Anesthesia team to evaluate on the day of surgery.   VS: BP (!) 170/80 Comment: manual   Pulse (!) 109    Temp 36.9 C (Oral)    Resp 18    Ht 5\' 2"  (1.575 m)    Wt 59.4 kg    SpO2 96%    BMI 23.94 kg/m    PROVIDERS: , PA-C is PCP  Lonie Peak, MD is cardiologist. Seen once on 05/23/21 for preoperative evaluation.    LABS: Labs reviewed: Acceptable for surgery. (all labs ordered are listed, but only abnormal results are displayed)  Labs Reviewed  SURGICAL PCR SCREEN - Abnormal; Notable for the following components:      Result Value   Staphylococcus aureus POSITIVE (*)    All other components within normal limits  CBC -  Abnormal; Notable for the following components:   Hemoglobin 15.2 (*)    All other components within normal limits  COMPREHENSIVE METABOLIC PANEL - Abnormal; Notable for the following components:   Sodium 133 (*)    Chloride 97 (*)    Glucose, Bld 122 (*)    BUN 7 (*)    All other components within normal limits  SARS CORONAVIRUS 2 (TAT 6-24 HRS)  PROTIME-INR  APTT  URINALYSIS, ROUTINE W REFLEX MICROSCOPIC  TYPE AND SCREEN     IMAGES: CTA Ao+BiFem 04/14/21: IMPRESSION: 1. Bilateral proximal common iliac artery stenoses of possible hemodynamic significance. 2. Distal RIGHT SFA and popliteal tandem stenoses, with diseased 2 vessel tibial runoff. 3. LEFT popliteal artery occlusive disease with limited evaluation of distal tibial runoff 4.  Aortic Atherosclerosis (ICD10-170.0). 5. Small hiatal hernia    EKG: 05/23/2021: Normal sinus rhythm.  Right bundle branch block.   CV: Nuclear stress test 06/03/21:   The study is normal. The study is low risk.   No ST deviation was noted.   LV perfusion is normal. There is no evidence of ischemia. There is no evidence of infarction.   Left ventricular function is normal. Nuclear stress EF: 67 %. The left ventricular ejection fraction is hyperdynamic (>65%). End diastolic cavity size is normal.    Echo 06/03/21: IMPRESSIONS   1. Left ventricular ejection fraction, by estimation, is 60 to 65%. The  left ventricle has normal function. The left ventricle has no  regional  wall motion abnormalities. Left ventricular diastolic parameters are  consistent with Grade I diastolic  dysfunction (impaired relaxation).   2. Right ventricular systolic function is normal. The right ventricular  size is normal.   3. Left atrial size was mildly dilated.   4. Cannot r/o PFO.   5. The mitral valve is abnormal. Trivial mitral valve regurgitation. No  evidence of mitral stenosis.   6. The aortic valve is tricuspid. There is mild calcification of the   aortic valve. Aortic valve regurgitation is not visualized. Aortic valve  sclerosis is present, with no evidence of aortic valve stenosis.   7. The inferior vena cava is normal in size with greater than 50%  respiratory variability, suggesting right atrial pressure of 3 mmHg.    US Carotid 04/20/21: Summary:  - Right Carotid: Velocities in the right ICA are consistent with a 1-39%  stenosis.                 Non-hemodynamically significant plaque <50% noted in the  CCA.  - Left Carotid: Velocities in the left ICA are consistent with a 1-39%  stenosis.  - Vertebrals:  Bilateral vertebral arteries demonstrate antegrade flow.  - Subclavians: Normal flow hemodynamics were seen in bilateral subclavian               arteries.    Past Medical History:  Diagnosis Date   Arthritis    B12 deficiency    Carotid artery stenosis    1-39% BICA stenosis 04/20/21 [place]   Dyspnea    Esophageal reflux    History of tobacco abuse    Hyperlipidemia    Hypertension    Hyponatremia    Osteoporosis    PAD (peripheral artery disease) (HCC)    Polycythemia     History reviewed. No pertinent surgical history.  MEDICATIONS:  cilostazol (PLETAL) 100 MG tablet   furosemide (LASIX) 20 MG tablet   ibuprofen (ADVIL) 800 MG tablet   losartan (COZAAR) 100 MG tablet   metoprolol succinate (TOPROL-XL) 100 MG 24 hr tablet   oxyCODONE-acetaminophen (PERCOCET/ROXICET) 5-325 MG tablet   No current facility-administered medications for this encounter.    Shonna Chock, PA-C Surgical Short Stay/Anesthesiology [facility] Phone [phone] [facility] Phone [phone] 06/20/2021 10:47 AM

## 2021-06-20 NOTE — Anesthesia Preprocedure Evaluation (Addendum)
Anesthesia Evaluation  Patient identified by MRN, date of birth, ID band Patient awake    Reviewed: Allergy & Precautions, NPO status , Patient's Chart, lab work & pertinent test results  History of Anesthesia Complications Negative for: history of anesthetic complications  Airway Mallampati: I  TM Distance: >3 FB Neck ROM: Full    Dental  (+) Edentulous Upper, Dental Advisory Given   Pulmonary Current Smoker and Patient abstained from smoking.,    + rhonchi        Cardiovascular hypertension, + Peripheral Vascular Disease  Normal cardiovascular exam  Nuclear stress test 06/03/21:  The study is normal. The study is low risk.  No ST deviation was noted.  LV perfusion is normal. There is no evidence of ischemia. There is no evidence of infarction.  Left ventricular function is normal. Nuclear stress EF: 67 %. The left ventricular ejection fraction is hyperdynamic (>65%). End diastolic cavity size is normal.   Echo 06/03/21: IMPRESSIONS  1. Left ventricular ejection fraction, by estimation, is 60 to 65%. The  left ventricle has normal function. The left ventricle has no regional  wall motion abnormalities. Left ventricular diastolic parameters are  consistent with Grade I diastolic  dysfunction (impaired relaxation).  2. Right ventricular systolic function is normal. The right ventricular  size is normal.  3. Left atrial size was mildly dilated.  4. Cannot r/o PFO.  5. The mitral valve is abnormal. Trivial mitral valve regurgitation. No  evidence of mitral stenosis.  6. The aortic valve is tricuspid. There is mild calcification of the  aortic valve. Aortic valve regurgitation is not visualized. Aortic valve  sclerosis is present, with no evidence of aortic valve stenosis.  7. The inferior vena cava is normal in size with greater than 50%  respiratory variability, suggesting right atrial pressure of 3 mmHg.      Neuro/Psych negative neurological ROS     GI/Hepatic Neg liver ROS, GERD  ,  Endo/Other  negative endocrine ROS  Renal/GU negative Renal ROS     Musculoskeletal  (+) Arthritis ,   Abdominal   Peds  Hematology negative hematology ROS (+)   Anesthesia Other Findings   Reproductive/Obstetrics                           Anesthesia Physical Anesthesia Plan  ASA: 3  Anesthesia Plan: General   Post-op Pain Management: Celebrex PO (pre-op) and Tylenol PO (pre-op)   Induction: Intravenous  PONV Risk Score and Plan: 4 or greater and Ondansetron, Dexamethasone, Midazolam and Scopolamine patch - Pre-op  Airway Management Planned: Oral ETT  Additional Equipment: Arterial line  Intra-op Plan:   Post-operative Plan: Post-operative intubation/ventilation  Informed Consent: I have reviewed the patients History and Physical, chart, labs and discussed the procedure including the risks, benefits and alternatives for the proposed anesthesia with the patient or authorized representative who has indicated his/her understanding and acceptance.     Dental advisory given  Plan Discussed with: Anesthesiologist and CRNA  Anesthesia Plan Comments: (PAT note written 06/20/2021 by Shonna Chock, PA-C. )      Anesthesia Quick Evaluation

## 2021-06-21 ENCOUNTER — Other Ambulatory Visit: Payer: Self-pay

## 2021-06-21 ENCOUNTER — Encounter (HOSPITAL_COMMUNITY): Payer: Self-pay | Admitting: Vascular Surgery

## 2021-06-21 ENCOUNTER — Inpatient Hospital Stay (HOSPITAL_COMMUNITY): Payer: BC Managed Care – PPO

## 2021-06-21 ENCOUNTER — Inpatient Hospital Stay (HOSPITAL_COMMUNITY): Payer: BC Managed Care – PPO | Admitting: Certified Registered Nurse Anesthetist

## 2021-06-21 ENCOUNTER — Inpatient Hospital Stay (HOSPITAL_COMMUNITY)
Admission: RE | Admit: 2021-06-21 | Discharge: 2021-06-28 | DRG: 269 | Disposition: A | Discharge status: Home / Self Care | Payer: BC Managed Care – PPO | Attending: Vascular Surgery | Admitting: Vascular Surgery

## 2021-06-21 ENCOUNTER — Encounter (HOSPITAL_COMMUNITY)
Admission: RE | Disposition: A | Discharge status: Home / Self Care | Payer: Self-pay | Source: Home / Self Care | Attending: Vascular Surgery

## 2021-06-21 DIAGNOSIS — I700 Atherosclerosis of aorta: Secondary | ICD-10-CM | POA: Diagnosis present

## 2021-06-21 DIAGNOSIS — M1990 Unspecified osteoarthritis, unspecified site: Secondary | ICD-10-CM | POA: Diagnosis present

## 2021-06-21 DIAGNOSIS — M810 Age-related osteoporosis without current pathological fracture: Secondary | ICD-10-CM | POA: Diagnosis present

## 2021-06-21 DIAGNOSIS — I7409 Other arterial embolism and thrombosis of abdominal aorta: Secondary | ICD-10-CM | POA: Diagnosis present

## 2021-06-21 DIAGNOSIS — E538 Deficiency of other specified B group vitamins: Secondary | ICD-10-CM | POA: Diagnosis present

## 2021-06-21 DIAGNOSIS — Z7902 Long term (current) use of antithrombotics/antiplatelets: Secondary | ICD-10-CM

## 2021-06-21 DIAGNOSIS — I70223 Atherosclerosis of native arteries of extremities with rest pain, bilateral legs: Principal | ICD-10-CM | POA: Diagnosis present

## 2021-06-21 DIAGNOSIS — Z79899 Other long term (current) drug therapy: Secondary | ICD-10-CM | POA: Diagnosis not present

## 2021-06-21 DIAGNOSIS — F17210 Nicotine dependence, cigarettes, uncomplicated: Secondary | ICD-10-CM | POA: Diagnosis present

## 2021-06-21 DIAGNOSIS — K219 Gastro-esophageal reflux disease without esophagitis: Secondary | ICD-10-CM | POA: Diagnosis present

## 2021-06-21 DIAGNOSIS — I10 Essential (primary) hypertension: Secondary | ICD-10-CM | POA: Diagnosis present

## 2021-06-21 DIAGNOSIS — E785 Hyperlipidemia, unspecified: Secondary | ICD-10-CM | POA: Diagnosis present

## 2021-06-21 DIAGNOSIS — I739 Peripheral vascular disease, unspecified: Secondary | ICD-10-CM | POA: Diagnosis present

## 2021-06-21 HISTORY — PX: AORTA - BILATERAL FEMORAL ARTERY BYPASS GRAFT: SHX1175

## 2021-06-21 LAB — POCT I-STAT 7, (LYTES, BLD GAS, ICA,H+H)
Acid-Base Excess: 0 mmol/L (ref 0.0–2.0)
Acid-base deficit: 2 mmol/L (ref 0.0–2.0)
Bicarbonate: 24.8 mmol/L (ref 20.0–28.0)
Bicarbonate: 23.8 mmol/L (ref 20.0–28.0)
Calcium, Ion: 1.18 mmol/L (ref 1.15–1.40)
Calcium, Ion: 1.1 mmol/L — ABNORMAL LOW (ref 1.15–1.40)
HCT: 41 % (ref 36.0–46.0)
HCT: 35 % — ABNORMAL LOW (ref 36.0–46.0)
Hemoglobin: 13.9 g/dL (ref 12.0–15.0)
Hemoglobin: 11.9 g/dL — ABNORMAL LOW (ref 12.0–15.0)
O2 Saturation: 99 %
O2 Saturation: 99 %
Patient temperature: 35.3
Patient temperature: 36.2
Potassium: 3.9 mmol/L (ref 3.5–5.1)
Potassium: 4.2 mmol/L (ref 3.5–5.1)
Sodium: 129 mmol/L — ABNORMAL LOW (ref 135–145)
Sodium: 130 mmol/L — ABNORMAL LOW (ref 135–145)
TCO2: 26 mmol/L (ref 22–32)
TCO2: 25 mmol/L (ref 22–32)
pCO2 arterial: 36.3 mmHg (ref 32–48)
pCO2 arterial: 41.5 mmHg (ref 32–48)
pH, Arterial: 7.435 (ref 7.35–7.45)
pH, Arterial: 7.363 (ref 7.35–7.45)
pO2, Arterial: 118 mmHg — ABNORMAL HIGH (ref 83–108)
pO2, Arterial: 138 mmHg — ABNORMAL HIGH (ref 83–108)

## 2021-06-21 LAB — BASIC METABOLIC PANEL
Anion gap: 9 (ref 5–15)
BUN: 8 mg/dL (ref 8–23)
CO2: 21 mmol/L — ABNORMAL LOW (ref 22–32)
Calcium: 8.2 mg/dL — ABNORMAL LOW (ref 8.9–10.3)
Chloride: 99 mmol/L (ref 98–111)
Creatinine, Ser: 0.63 mg/dL (ref 0.44–1.00)
GFR, Estimated: >60 mL/min (ref >60)
Glucose, Bld: 173 mg/dL — ABNORMAL HIGH (ref 70–99)
Potassium: 4.1 mmol/L (ref 3.5–5.1)
Sodium: 129 mmol/L — ABNORMAL LOW (ref 135–145)

## 2021-06-21 LAB — BLOOD GAS, ARTERIAL
Acid-base deficit: 2.6 mmol/L — ABNORMAL HIGH (ref 0.0–2.0)
Bicarbonate: 22.4 mmol/L (ref 20.0–28.0)
FIO2: 52 %
O2 Saturation: 94.4 %
Patient temperature: 37
pCO2 arterial: 44 mmHg (ref 32–48)
pH, Arterial: 7.328 — ABNORMAL LOW (ref 7.35–7.45)
pO2, Arterial: 84.6 mmHg (ref 83–108)

## 2021-06-21 LAB — CBC
HCT: 39.7 % (ref 36.0–46.0)
Hemoglobin: 13.1 g/dL (ref 12.0–15.0)
MCH: 32.6 pg (ref 26.0–34.0)
MCHC: 33 g/dL (ref 30.0–36.0)
MCV: 98.8 fL (ref 80.0–100.0)
Platelets: 280 10*3/uL (ref 150–400)
RBC: 4.02 MIL/uL (ref 3.87–5.11)
RDW: 12 % (ref 11.5–15.5)
WBC: 16.2 10*3/uL — ABNORMAL HIGH (ref 4.0–10.5)
nRBC: 0 % (ref 0.0–0.2)

## 2021-06-21 LAB — GLUCOSE, CAPILLARY: Glucose-Capillary: 173 mg/dL — ABNORMAL HIGH (ref 70–99)

## 2021-06-21 LAB — POCT ACTIVATED CLOTTING TIME
Activated Clotting Time: 215 seconds
Activated Clotting Time: 275 seconds
Activated Clotting Time: 209 seconds

## 2021-06-21 LAB — MAGNESIUM: Magnesium: 1.3 mg/dL — ABNORMAL LOW (ref 1.7–2.4)

## 2021-06-21 LAB — APTT: aPTT: 28 seconds (ref 24–36)

## 2021-06-21 LAB — PREPARE RBC (CROSSMATCH)

## 2021-06-21 LAB — PROTIME-INR
INR: 1.1 (ref 0.8–1.2)
Prothrombin Time: 14.5 seconds (ref 11.4–15.2)

## 2021-06-21 LAB — ABO/RH: ABO/RH(D): B POS

## 2021-06-21 SURGERY — CREATION, BYPASS, ARTERIAL, AORTA TO FEMORAL, BILATERAL, USING GRAFT
Anesthesia: General | Site: Abdomen

## 2021-06-21 MED ORDER — PROMETHAZINE HCL 25 MG/ML IJ SOLN: 6.2500 mg | INTRAMUSCULAR | Status: DC | PRN

## 2021-06-21 MED ORDER — ONDANSETRON HCL 4 MG/2ML IJ SOLN
INTRAMUSCULAR | Status: AC
  Filled 2021-06-21: qty 2

## 2021-06-21 MED ORDER — MIDAZOLAM HCL 2 MG/2ML IJ SOLN
INTRAMUSCULAR | Status: DC | PRN
  Administered 2021-06-21: 2 mg via INTRAVENOUS

## 2021-06-21 MED ORDER — PANTOPRAZOLE SODIUM 40 MG PO TBEC
40.0000 mg | DELAYED_RELEASE_TABLET | Freq: Every day | ORAL | Status: DC
  Filled 2021-06-21: qty 1

## 2021-06-21 MED ORDER — SODIUM CHLORIDE 0.9 % IV SOLN
INTRAVENOUS | Status: DC
Start: 2021-06-21 — End: 2021-06-21

## 2021-06-21 MED ORDER — PROPOFOL 10 MG/ML IV BOLUS
INTRAVENOUS | Status: DC | PRN
Start: 2021-06-21 — End: 2021-06-21
  Administered 2021-06-21: 130 mg via INTRAVENOUS

## 2021-06-21 MED ORDER — MAGNESIUM SULFATE 2 GM/50ML IV SOLN
2.0000 g | Freq: Every day | INTRAVENOUS | Status: AC | PRN
  Administered 2021-06-21: 2 g via INTRAVENOUS
  Filled 2021-06-21: qty 50

## 2021-06-21 MED ORDER — FENTANYL CITRATE (PF) 100 MCG/2ML IJ SOLN
25.0000 ug | INTRAMUSCULAR | Status: DC | PRN
  Administered 2021-06-21 (×3): 25 ug via INTRAVENOUS

## 2021-06-21 MED ORDER — SUGAMMADEX SODIUM 200 MG/2ML IV SOLN
INTRAVENOUS | Status: DC | PRN
  Administered 2021-06-21: 200 mg via INTRAVENOUS

## 2021-06-21 MED ORDER — MIDAZOLAM HCL 2 MG/2ML IJ SOLN
INTRAMUSCULAR | Status: AC
  Filled 2021-06-21: qty 2

## 2021-06-21 MED ORDER — ROCURONIUM BROMIDE 10 MG/ML (PF) SYRINGE
PREFILLED_SYRINGE | INTRAVENOUS | Status: DC | PRN
  Administered 2021-06-21: 50 mg via INTRAVENOUS
  Administered 2021-06-21: 80 mg via INTRAVENOUS
  Administered 2021-06-21 (×2): 20 mg via INTRAVENOUS
  Administered 2021-06-21: 30 mg via INTRAVENOUS

## 2021-06-21 MED ORDER — SCOPOLAMINE 1 MG/3DAYS TD PT72
1.0000 | MEDICATED_PATCH | TRANSDERMAL | Status: DC
  Administered 2021-06-21: 1.5 mg via TRANSDERMAL
  Filled 2021-06-21: qty 1

## 2021-06-21 MED ORDER — SODIUM CHLORIDE 0.9% IV SOLUTION: Freq: Once | INTRAVENOUS | Status: DC

## 2021-06-21 MED ORDER — OXYCODONE-ACETAMINOPHEN 5-325 MG PO TABS
1.0000 | ORAL_TABLET | ORAL | Status: DC | PRN
  Administered 2021-06-22: 21:00:00 1 via ORAL
  Administered 2021-06-23 – 2021-06-24 (×5): 2 via ORAL
  Administered 2021-06-25 – 2021-06-26 (×6): 1 via ORAL
  Administered 2021-06-27: 2 via ORAL
  Administered 2021-06-27 – 2021-06-28 (×2): 1 via ORAL
  Filled 2021-06-21 (×6): qty 1
  Filled 2021-06-21 (×4): qty 2
  Filled 2021-06-21 (×2): qty 1
  Filled 2021-06-21 (×2): qty 2
  Filled 2021-06-21 (×2): qty 1
  Filled 2021-06-21: qty 2
  Filled 2021-06-21: qty 1

## 2021-06-21 MED ORDER — HYDRALAZINE HCL 20 MG/ML IJ SOLN
5.0000 mg | INTRAMUSCULAR | Status: DC | PRN
  Administered 2021-06-21: 18:00:00 5 mg via INTRAVENOUS
  Filled 2021-06-21: qty 1

## 2021-06-21 MED ORDER — ALBUMIN HUMAN 5 % IV SOLN: INTRAVENOUS | Status: DC | PRN

## 2021-06-21 MED ORDER — CLEVIDIPINE BUTYRATE 0.5 MG/ML IV EMUL
INTRAVENOUS | Status: DC | PRN
  Administered 2021-06-21: 1 mg/h via INTRAVENOUS

## 2021-06-21 MED ORDER — MUPIROCIN 2 % EX OINT
1.0000 "application " | TOPICAL_OINTMENT | Freq: Two times a day (BID) | CUTANEOUS | Status: AC
  Administered 2021-06-21 – 2021-06-26 (×10): 1 via NASAL
  Filled 2021-06-21 (×2): qty 22

## 2021-06-21 MED ORDER — ORAL CARE MOUTH RINSE
15.0000 mL | Freq: Two times a day (BID) | OROMUCOSAL | Status: DC
  Administered 2021-06-22 – 2021-06-28 (×7): 15 mL via OROMUCOSAL

## 2021-06-21 MED ORDER — ESMOLOL HCL 100 MG/10ML IV SOLN
INTRAVENOUS | Status: AC
  Filled 2021-06-21: qty 10

## 2021-06-21 MED ORDER — MORPHINE SULFATE (PF) 2 MG/ML IV SOLN
2.0000 mg | INTRAVENOUS | Status: DC | PRN
  Administered 2021-06-21 (×3): 2 mg via INTRAVENOUS
  Administered 2021-06-22: 4 mg via INTRAVENOUS
  Administered 2021-06-22: 2 mg via INTRAVENOUS
  Administered 2021-06-22 – 2021-06-23 (×5): 4 mg via INTRAVENOUS
  Filled 2021-06-21 (×3): qty 1
  Filled 2021-06-21 (×4): qty 2
  Filled 2021-06-21: qty 1
  Filled 2021-06-21 (×2): qty 2

## 2021-06-21 MED ORDER — FENTANYL CITRATE (PF) 250 MCG/5ML IJ SOLN
INTRAMUSCULAR | Status: DC | PRN
  Administered 2021-06-21 (×4): 50 ug via INTRAVENOUS
  Administered 2021-06-21: 100 ug via INTRAVENOUS
  Administered 2021-06-21 (×4): 50 ug via INTRAVENOUS

## 2021-06-21 MED ORDER — PHENOL 1.4 % MT LIQD: 1.0000 | OROMUCOSAL | Status: DC | PRN

## 2021-06-21 MED ORDER — ROCURONIUM BROMIDE 10 MG/ML (PF) SYRINGE
PREFILLED_SYRINGE | INTRAVENOUS | Status: AC
  Filled 2021-06-21: qty 10

## 2021-06-21 MED ORDER — STERILE WATER FOR IRRIGATION IR SOLN
Status: DC | PRN
  Administered 2021-06-21: 1000 mL

## 2021-06-21 MED ORDER — DEXAMETHASONE SODIUM PHOSPHATE 10 MG/ML IJ SOLN
INTRAMUSCULAR | Status: AC
  Filled 2021-06-21: qty 1

## 2021-06-21 MED ORDER — HEPARIN 6000 UNIT IRRIGATION SOLUTION
Status: DC | PRN
  Administered 2021-06-21: 1

## 2021-06-21 MED ORDER — CLEVIDIPINE BUTYRATE 0.5 MG/ML IV EMUL
0.0000 mg/h | INTRAVENOUS | Status: DC
  Administered 2021-06-21: 1 mg/h via INTRAVENOUS

## 2021-06-21 MED ORDER — ALBUTEROL SULFATE (2.5 MG/3ML) 0.083% IN NEBU
INHALATION_SOLUTION | RESPIRATORY_TRACT | Status: AC
  Administered 2021-06-21: 2.5 mg via RESPIRATORY_TRACT
  Filled 2021-06-21: qty 3

## 2021-06-21 MED ORDER — ONDANSETRON HCL 4 MG/2ML IJ SOLN
4.0000 mg | Freq: Four times a day (QID) | INTRAMUSCULAR | Status: DC | PRN
  Administered 2021-06-21 – 2021-06-23 (×2): 4 mg via INTRAVENOUS
  Filled 2021-06-21 (×2): qty 2

## 2021-06-21 MED ORDER — ACETAMINOPHEN 325 MG PO TABS
325.0000 mg | ORAL_TABLET | ORAL | Status: DC | PRN
  Administered 2021-06-22: 325 mg via ORAL
  Filled 2021-06-21: qty 1
  Filled 2021-06-21: qty 2

## 2021-06-21 MED ORDER — ALBUTEROL SULFATE (2.5 MG/3ML) 0.083% IN NEBU: 2.5000 mg | INHALATION_SOLUTION | RESPIRATORY_TRACT | Status: AC

## 2021-06-21 MED ORDER — LACTATED RINGERS IV SOLN: INTRAVENOUS | Status: DC | PRN

## 2021-06-21 MED ORDER — LABETALOL HCL 5 MG/ML IV SOLN
10.0000 mg | INTRAVENOUS | Status: DC | PRN
  Filled 2021-06-21: qty 4

## 2021-06-21 MED ORDER — SODIUM CHLORIDE 0.9 % IV SOLN: 500.0000 mL | Freq: Once | INTRAVENOUS | Status: DC | PRN

## 2021-06-21 MED ORDER — GUAIFENESIN-DM 100-10 MG/5ML PO SYRP
15.0000 mL | ORAL_SOLUTION | ORAL | Status: DC | PRN
  Administered 2021-06-24 – 2021-06-26 (×4): 15 mL via ORAL
  Filled 2021-06-21 (×4): qty 15

## 2021-06-21 MED ORDER — CEFAZOLIN SODIUM-DEXTROSE 2-4 GM/100ML-% IV SOLN
2.0000 g | INTRAVENOUS | Status: AC
  Administered 2021-06-21: 2 g via INTRAVENOUS
  Filled 2021-06-21: qty 100

## 2021-06-21 MED ORDER — PHENYLEPHRINE HCL-NACL 20-0.9 MG/250ML-% IV SOLN
INTRAVENOUS | Status: DC | PRN
  Administered 2021-06-21: 25 ug/min via INTRAVENOUS

## 2021-06-21 MED ORDER — HEPARIN SODIUM (PORCINE) 1000 UNIT/ML IJ SOLN
INTRAMUSCULAR | Status: AC
  Filled 2021-06-21: qty 10

## 2021-06-21 MED ORDER — FENTANYL CITRATE (PF) 100 MCG/2ML IJ SOLN
INTRAMUSCULAR | Status: AC
  Administered 2021-06-21: 0.25 ug
  Filled 2021-06-21: qty 2

## 2021-06-21 MED ORDER — 0.9 % SODIUM CHLORIDE (POUR BTL) OPTIME
TOPICAL | Status: DC | PRN
  Administered 2021-06-21 (×2): 1000 mL

## 2021-06-21 MED ORDER — CEFAZOLIN SODIUM-DEXTROSE 2-4 GM/100ML-% IV SOLN
2.0000 g | Freq: Three times a day (TID) | INTRAVENOUS | Status: AC
  Administered 2021-06-21 – 2021-06-22 (×2): 2 g via INTRAVENOUS
  Filled 2021-06-21 (×2): qty 100

## 2021-06-21 MED ORDER — LIDOCAINE 2% (20 MG/ML) 5 ML SYRINGE
INTRAMUSCULAR | Status: AC
  Filled 2021-06-21: qty 5

## 2021-06-21 MED ORDER — CLEVIDIPINE BUTYRATE 0.5 MG/ML IV EMUL
INTRAVENOUS | Status: AC
  Filled 2021-06-21: qty 50

## 2021-06-21 MED ORDER — LACTATED RINGERS IV SOLN: INTRAVENOUS | Status: DC

## 2021-06-21 MED ORDER — ACETAMINOPHEN 325 MG RE SUPP
325.0000 mg | RECTAL | Status: DC | PRN
  Administered 2021-06-22: 05:00:00 650 mg via RECTAL
  Filled 2021-06-21 (×2): qty 2

## 2021-06-21 MED ORDER — ONDANSETRON HCL 4 MG/2ML IJ SOLN
INTRAMUSCULAR | Status: DC | PRN
Start: 2021-06-21 — End: 2021-06-21
  Administered 2021-06-21: 4 mg via INTRAVENOUS

## 2021-06-21 MED ORDER — HEMOSTATIC AGENTS (NO CHARGE) OPTIME
TOPICAL | Status: DC | PRN
  Administered 2021-06-21: 1 via TOPICAL

## 2021-06-21 MED ORDER — CELECOXIB 200 MG PO CAPS
200.0000 mg | ORAL_CAPSULE | Freq: Once | ORAL | Status: AC
  Administered 2021-06-21: 200 mg via ORAL
  Filled 2021-06-21: qty 1

## 2021-06-21 MED ORDER — ORAL CARE MOUTH RINSE: 15.0000 mL | Freq: Once | OROMUCOSAL | Status: AC

## 2021-06-21 MED ORDER — PHENYLEPHRINE 40 MCG/ML (10ML) SYRINGE FOR IV PUSH (FOR BLOOD PRESSURE SUPPORT)
PREFILLED_SYRINGE | INTRAVENOUS | Status: DC | PRN
  Administered 2021-06-21 (×3): 40 ug via INTRAVENOUS

## 2021-06-21 MED ORDER — DOCUSATE SODIUM 100 MG PO CAPS
100.0000 mg | ORAL_CAPSULE | Freq: Every day | ORAL | Status: DC
  Administered 2021-06-22 – 2021-06-27 (×6): 100 mg via ORAL
  Filled 2021-06-21 (×7): qty 1

## 2021-06-21 MED ORDER — FENTANYL CITRATE (PF) 250 MCG/5ML IJ SOLN
INTRAMUSCULAR | Status: AC
  Filled 2021-06-21: qty 5

## 2021-06-21 MED ORDER — CHLORHEXIDINE GLUCONATE 0.12 % MT SOLN
15.0000 mL | Freq: Once | OROMUCOSAL | Status: AC
  Administered 2021-06-21: 15 mL via OROMUCOSAL
  Filled 2021-06-21: qty 15

## 2021-06-21 MED ORDER — PANTOPRAZOLE SODIUM 40 MG IV SOLR
40.0000 mg | INTRAVENOUS | Status: DC
  Administered 2021-06-21 – 2021-06-22 (×2): 40 mg via INTRAVENOUS
  Filled 2021-06-21 (×2): qty 10

## 2021-06-21 MED ORDER — SODIUM CHLORIDE 0.9 % IV SOLN: INTRAVENOUS | Status: AC

## 2021-06-21 MED ORDER — PROPOFOL 10 MG/ML IV BOLUS
INTRAVENOUS | Status: AC
  Filled 2021-06-21: qty 20

## 2021-06-21 MED ORDER — ACETAMINOPHEN 500 MG PO TABS
1000.0000 mg | ORAL_TABLET | Freq: Once | ORAL | Status: AC
  Administered 2021-06-21: 1000 mg via ORAL
  Filled 2021-06-21: qty 2

## 2021-06-21 MED ORDER — AMISULPRIDE (ANTIEMETIC) 5 MG/2ML IV SOLN: 10.0000 mg | Freq: Once | INTRAVENOUS | Status: DC | PRN

## 2021-06-21 MED ORDER — LIDOCAINE 2% (20 MG/ML) 5 ML SYRINGE
INTRAMUSCULAR | Status: DC | PRN
  Administered 2021-06-21: 100 mg via INTRAVENOUS

## 2021-06-21 MED ORDER — CHLORHEXIDINE GLUCONATE CLOTH 2 % EX PADS: 6.0000 | MEDICATED_PAD | Freq: Once | CUTANEOUS | Status: DC

## 2021-06-21 MED ORDER — METOPROLOL TARTRATE 5 MG/5ML IV SOLN: 2.0000 mg | INTRAVENOUS | Status: DC | PRN

## 2021-06-21 MED ORDER — POTASSIUM CHLORIDE CRYS ER 20 MEQ PO TBCR: 20.0000 meq | EXTENDED_RELEASE_TABLET | Freq: Every day | ORAL | Status: DC | PRN

## 2021-06-21 MED ORDER — DEXAMETHASONE SODIUM PHOSPHATE 10 MG/ML IJ SOLN
INTRAMUSCULAR | Status: DC | PRN
  Administered 2021-06-21: 10 mg via INTRAVENOUS

## 2021-06-21 MED ORDER — ALUM & MAG HYDROXIDE-SIMETH 200-200-20 MG/5ML PO SUSP: 15.0000 mL | ORAL | Status: DC | PRN

## 2021-06-21 MED ORDER — HEPARIN SODIUM (PORCINE) 1000 UNIT/ML IJ SOLN
INTRAMUSCULAR | Status: DC | PRN
  Administered 2021-06-21 (×2): 3000 [IU] via INTRAVENOUS
  Administered 2021-06-21: 6000 [IU] via INTRAVENOUS

## 2021-06-21 MED ORDER — CHLORHEXIDINE GLUCONATE CLOTH 2 % EX PADS
6.0000 | MEDICATED_PAD | Freq: Every day | CUTANEOUS | Status: DC
  Administered 2021-06-22: 6 via TOPICAL

## 2021-06-21 MED ORDER — PROTAMINE SULFATE 10 MG/ML IV SOLN
INTRAVENOUS | Status: DC | PRN
Start: 2021-06-21 — End: 2021-06-21
  Administered 2021-06-21: 10 mg via INTRAVENOUS
  Administered 2021-06-21 (×2): 20 mg via INTRAVENOUS

## 2021-06-21 SURGICAL SUPPLY — 57 items
BAG COUNTER SPONGE SURGICOUNT (BAG) ×2 IMPLANT
CANISTER SUCT 3000ML PPV (MISCELLANEOUS) ×2 IMPLANT
CLIP LIGATING EXTRA MED SLVR (CLIP) ×2 IMPLANT
CLIP LIGATING EXTRA SM BLUE (MISCELLANEOUS) ×2 IMPLANT
COVER LIGHT HANDLE UNIVERSAL (MISCELLANEOUS) ×1 IMPLANT
COVER PROBE W GEL 5X96 (DRAPES) ×1 IMPLANT
DERMABOND ADVANCED (GAUZE/BANDAGES/DRESSINGS) ×3
DERMABOND ADVANCED .7 DNX12 (GAUZE/BANDAGES/DRESSINGS) ×2 IMPLANT
ELECT BLADE 4.0 EZ CLEAN MEGAD (MISCELLANEOUS) ×2
ELECT BLADE 6.5 EXT (BLADE) ×1 IMPLANT
ELECT CAUTERY BLADE 6.4 (BLADE) ×1 IMPLANT
ELECT REM PT RETURN 9FT ADLT (ELECTROSURGICAL) ×4
ELECTRODE BLDE 4.0 EZ CLN MEGD (MISCELLANEOUS) ×1 IMPLANT
ELECTRODE REM PT RTRN 9FT ADLT (ELECTROSURGICAL) ×1 IMPLANT
FELT TEFLON 1X6 (MISCELLANEOUS) IMPLANT
GAUZE 4X4 16PLY ~~LOC~~+RFID DBL (SPONGE) ×1 IMPLANT
GLOVE SURG ENC MOIS LTX SZ7.5 (GLOVE) ×2 IMPLANT
GOWN STRL REUS W/ TWL LRG LVL3 (GOWN DISPOSABLE) ×2 IMPLANT
GOWN STRL REUS W/ TWL XL LVL3 (GOWN DISPOSABLE) ×1 IMPLANT
GOWN STRL REUS W/TWL LRG LVL3 (GOWN DISPOSABLE) ×4
GOWN STRL REUS W/TWL XL LVL3 (GOWN DISPOSABLE) ×2
GRAFT HEMASHIELD 14X7MM (Vascular Products) ×1 IMPLANT
INSERT FOGARTY 61MM (MISCELLANEOUS) ×2 IMPLANT
INSERT FOGARTY SM (MISCELLANEOUS) ×6 IMPLANT
KIT BASIN OR (CUSTOM PROCEDURE TRAY) ×2 IMPLANT
KIT TURNOVER KIT B (KITS) ×2 IMPLANT
NS IRRIG 1000ML POUR BTL (IV SOLUTION) ×4 IMPLANT
PACK AORTA (CUSTOM PROCEDURE TRAY) ×2 IMPLANT
PAD ARMBOARD 7.5X6 YLW CONV (MISCELLANEOUS) ×4 IMPLANT
PENCIL BUTTON HOLSTER BLD 10FT (ELECTRODE) ×1 IMPLANT
POWDER SURGICEL 3.0 GRAM (HEMOSTASIS) ×2 IMPLANT
SPONGE T-LAP 18X18 ~~LOC~~+RFID (SPONGE) ×5 IMPLANT
SUT MNCRL AB 4-0 PS2 18 (SUTURE) ×4 IMPLANT
SUT PDS AB 1 TP1 54 (SUTURE) ×4 IMPLANT
SUT PROLENE 3 0 SH 48 (SUTURE) ×5 IMPLANT
SUT PROLENE 4 0 RB 1 (SUTURE) ×4
SUT PROLENE 4-0 RB1 .5 CRCL 36 (SUTURE) IMPLANT
SUT PROLENE 5 0 C 1 24 (SUTURE) ×4 IMPLANT
SUT PROLENE 5 0 C 1 36 (SUTURE) ×1 IMPLANT
SUT PROLENE 6 0 BV (SUTURE) ×1 IMPLANT
SUT SILK 2 0 (SUTURE) ×2
SUT SILK 2 0 TIES 17X18 (SUTURE) ×2
SUT SILK 2 0SH CR/8 30 (SUTURE) ×3 IMPLANT
SUT SILK 2-0 18XBRD TIE 12 (SUTURE) ×1 IMPLANT
SUT SILK 2-0 18XBRD TIE BLK (SUTURE) ×1 IMPLANT
SUT SILK 3 0 (SUTURE) ×2
SUT SILK 3 0 TIES 17X18 (SUTURE) ×2
SUT SILK 3-0 18XBRD TIE 12 (SUTURE) ×1 IMPLANT
SUT SILK 3-0 18XBRD TIE BLK (SUTURE) ×1 IMPLANT
SUT VIC AB 2-0 CT1 27 (SUTURE) ×10
SUT VIC AB 2-0 CT1 TAPERPNT 27 (SUTURE) ×5 IMPLANT
SUT VIC AB 3-0 SH 27 (SUTURE) ×4
SUT VIC AB 3-0 SH 27X BRD (SUTURE) ×2 IMPLANT
TOWEL GREEN STERILE (TOWEL DISPOSABLE) ×2 IMPLANT
TOWEL ~~LOC~~+RFID 17X26 BLUE (SPONGE) ×2 IMPLANT
TRAY FOLEY MTR SLVR 16FR STAT (SET/KITS/TRAYS/PACK) ×2 IMPLANT
WATER STERILE IRR 1000ML POUR (IV SOLUTION) ×4 IMPLANT

## 2021-06-21 NOTE — Plan of Care (Signed)

## 2021-06-21 NOTE — Op Note (Signed)
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 Randie Heinz, MD Assistants: Gillis Santa, MD; Aggie Moats, [place] Procedure Performed: 1.  Bilateral common femoral and aortic endarterectomy 2.  Aortobifemoral bypass with 14 x 7 bifurcated dacron graft end-to-end to aorta and into side to bilateral common femoral arteries  Indications: 66 year old female with history of limb threatening ischemia bilaterally with rest pain.  We evaluated her with CT scan she is undergoing cardiac clearance and she is indicated for aortobifemoral bypass.  She has heavy calcium proximal in her aorta and also heavily diseased bilateral common femoral arteries.  Assistants were necessary to facilitate exposure and expedite the case.  Findings: Bilateral common femoral arteries were heavily calcified.  On the right side this was subtotally occluded at the bifurcation endarterectomy was performed we had good backbleeding from the SFA and from 2 profunda branches.  On the left side there was at least a 70% stenosis and endarterectomy was performed and there was excellent backbleeding from the profunda with somewhat less so from the SFA.  The aorta was heavily calcified this was ligated above the IMA and at completion there was monophasic flow in the IMA.  Proximally she required aortic endarterectomy just over the graft just below the lowest left renal artery.  At completion there was a very strong left dorsalis pedis artery with a monophasic right dorsalis pedis artery that was much weaker.   Procedure:  The patient was identified in the holding area and taken to the operating where she was placed supine on upper table and general anesthesia was induced.  She was sterilely prepped and draped in the abdomen bilateral lower extremities in usual  fashion, antibiotics were minister timeout was called.  Ultrasound was used to identify both common femoral arteries and longitudinal incisions were made with plans for bilateral common femoral endarterectomies.  The external iliac, common femoral and profunda and SFA arteries were dissected on both sides and Vesseloops placed around these.  The crossing veins were divided on both sides under the inguinal ligament and tunnels were begun.  Midline abdominal incision was made.  We dissected down through the skin and subcutaneous tissue to the midline fascia and this was opened for the entirety incision.  The intestine was inspected noted no disease processes well perfused.  NG tube was confirmed in place and affixed the patient by anesthesia.  The small intestine was mobilized to the patient's right there was minimal lysis of adhesions.  The retroperitoneum was opened and the aorta was exposed.  An Omni-Tract self-retaining tractor was placed.  Renal vein was identified followed by the bilateral renal arteries with the left being the lowest.  Umbilical tape was placed around the aorta at this level.  The IMA was protected.  We dissected down aortic bifurcation and we able to complete tunnel tracks posterior to both ureters umbilical tapes were placed through the tract patient was fully heparinized.  We clamped the aorta just above the IMA followed by an AP clamp placed just below the left renal artery.  The aorta was transected.  We did debulk some of the aorta and then oversewed with 3-0 Prolene suture in a running mattress fashion and the distal clamp was taken off.  We then performed aortic or endarterectomy of the proximal aorta.  A 14 x 7 mm graft was then trimmed to  size and sewn end-to-end with 3-0 Prolene suture.  Prior to completion we flushed through the limbs we are able to tighten our suture line and then completed the suture line and released the proximal aortic clamp after clamping both limbs.  1 repair  stitch was placed with 4-0 Prolene on the patient's right lateral side of the anastomosis.  Hemostasis was then obtained.  We clamped the graft proximally flush both limbs and tunneled them anatomically.  We started with the right side and clamped the outflow followed by the inflow.  We opened the vessel longitudinally extensive endarterectomy was performed up to the external leg artery down to the SFA and both profunda femoris arteries.  We have very good backbleeding from both profundus there was minimal backbleeding from the SFA.  The graft was trimmed to size and sewn in place with 5-0 Prolene suture.  Prior completion of flushing all directions.  Upon completion we released our clamp there was about 20 mmHg drop in the systolic blood pressure there was good signal in the runoff profundus and high resistance signal in the SFA.  We turned our attention to the patient's left.  Again we clamped the outflow followed by the inflow opened the vessel longitudinally.  Medially there was a very large calcified plaque and endarterectomy was performed until we had good backbleeding from the SFA and the profunda and also antegrade bleeding from the external leg artery.  The graft was then trimmed to size and sewn in place with 5-0 Prolene suture.  Again prior completion we flushed in all directions.  Upon completion again there was approximately 15 to 20 mmHg drop in systolic blood pressure.  Patient quickly recovered.  We irrigated both the wounds.  We turned our attention towards the abdomen.  There was good flow in the IMA although monophasic.  All the intestine appeared viable and again we ran the small bowel.  Colon all appeared viable.  We administered 50 mg of protamine.  We thoroughly irrigated the retroperitoneum and closed the retroperitoneum over the graft with running 2-0 Vicryl.  All abdominal contents were returned we closed the midline fascia with running #1 PDS suture.  Midline incision was closed with 4-0  Monocryl suture and Dermabond was placed at the skin level.  We turned attention to both groins which were irrigated hemostasis was obtained we closed in layers of Vicryl and Monocryl.  Again Dermabond was placed at the skin level.  Patient was then awakened from anesthesia having tolerated procedure without immediate complication.  All counts were correct at completion.  EBL: 650 cc  Transfusion: 200 cc Cell Saver     Cordney Barstow C. Randie Heinz, MD [facility] Office: [phone] Pager: [phone]

## 2021-06-21 NOTE — Interval H&P Note (Signed)
History and Physical Interval Note:  06/21/2021 7:19 AM  Kathryn Fowler  has presented today for surgery, with the diagnosis of PAD.  The various methods of treatment have been discussed with the patient and family. After consideration of risks, benefits and other options for treatment, the patient has consented to  Procedure(s): AORTOBIFEMORAL BYPASS GRAFT (N/A) as a surgical intervention.  The patient's history has been reviewed, patient examined, no change in status, stable for surgery.  I have reviewed the patient's chart and labs.  Questions were answered to the patient's satisfaction.     [name]

## 2021-06-21 NOTE — Anesthesia Procedure Notes (Signed)
Arterial Line Insertion Start/End2/14/2023 6:55 AM, 06/21/2021 7:05 AM Performed by: Nils Pyle, CRNA, CRNA  Patient location: Pre-op. Preanesthetic checklist: patient identified, IV checked, site marked, risks and benefits discussed, surgical consent, monitors and equipment checked, pre-op evaluation and anesthesia consent Lidocaine 1% used for infiltration Left, radial was placed Catheter size: 20 G Hand hygiene performed  and maximum sterile barriers used   Attempts: 1 Procedure performed without using ultrasound guided technique. Ultrasound Notes:anatomy identified, needle tip was noted to be adjacent to the nerve/plexus identified and no ultrasound evidence of intravascular and/or intraneural injection Following insertion, dressing applied and Biopatch. Post procedure assessment: normal and unchanged  Patient tolerated the procedure well with no immediate complications.

## 2021-06-21 NOTE — Anesthesia Procedure Notes (Signed)

## 2021-06-21 NOTE — Anesthesia Postprocedure Evaluation (Signed)
Anesthesia Post Note  Patient: Kathryn Fowler  Procedure(s) Performed: AORTOBIFEMORAL BYPASS GRAFT (Abdomen) APPLICATION OF CELL SAVER (Abdomen)     Patient location during evaluation: PACU Anesthesia Type: General Level of consciousness: sedated Pain management: pain level controlled Vital Signs Assessment: post-procedure vital signs reviewed and stable Respiratory status: spontaneous breathing and respiratory function stable Cardiovascular status: stable Postop Assessment: no apparent nausea or vomiting Anesthetic complications: no   No notable events documented.  Last Vitals:  Vitals:   06/21/21 1245 06/21/21 1300  BP: (!) 160/69 139/68  Pulse: 86 89  Resp: 14 16  Temp:    SpO2: 93% 94%    Last Pain:  Vitals:   06/21/21 0623  TempSrc:   PainSc: 8                  Donathan Buller DANIEL

## 2021-06-21 NOTE — Transfer of Care (Signed)
Immediate Anesthesia Transfer of Care Note  Patient: Kathryn Fowler  Procedure(s) Performed: AORTOBIFEMORAL BYPASS GRAFT (Abdomen) APPLICATION OF CELL SAVER (Abdomen)  Patient Location: PACU  Anesthesia Type:General  Level of Consciousness: awake, alert  and oriented  Airway & Oxygen Therapy: Patient Spontanous Breathing and Patient connected to face mask oxygen  Post-op Assessment: Report given to RN, Post -op Vital signs reviewed and stable and Patient moving all extremities X 4  Post vital signs: Reviewed and stable  Last Vitals:  Vitals Value Taken Time  BP 155/90 06/21/21 1157  Temp    Pulse 84 06/21/21 1159  Resp 17 06/21/21 1159  SpO2 92 % 06/21/21 1159  Vitals shown include unvalidated device data.  Last Pain:  Vitals:   06/21/21 0623  TempSrc:   PainSc: 8       Patients Stated Pain Goal: 3 ([id number])  Complications: No notable events documented.

## 2021-06-22 ENCOUNTER — Encounter (HOSPITAL_COMMUNITY): Payer: Self-pay | Admitting: Vascular Surgery

## 2021-06-22 ENCOUNTER — Inpatient Hospital Stay (HOSPITAL_COMMUNITY): Payer: BC Managed Care – PPO

## 2021-06-22 LAB — POCT I-STAT 7, (LYTES, BLD GAS, ICA,H+H)
Acid-Base Excess: 0 mmol/L (ref 0.0–2.0)
Bicarbonate: 25.8 mmol/L (ref 20.0–28.0)
Calcium, Ion: 1.18 mmol/L (ref 1.15–1.40)
HCT: 39 % (ref 36.0–46.0)
Hemoglobin: 13.3 g/dL (ref 12.0–15.0)
O2 Saturation: 90 %
Patient temperature: 98.9
Potassium: 4 mmol/L (ref 3.5–5.1)
Sodium: 133 mmol/L — ABNORMAL LOW (ref 135–145)
TCO2: 27 mmol/L (ref 22–32)
pCO2 arterial: 43.6 mmHg (ref 32–48)
pH, Arterial: 7.381 (ref 7.35–7.45)
pO2, Arterial: 61 mmHg — ABNORMAL LOW (ref 83–108)

## 2021-06-22 LAB — COMPREHENSIVE METABOLIC PANEL
ALT: 11 U/L (ref 0–44)
AST: 17 U/L (ref 15–41)
Albumin: 3.1 g/dL — ABNORMAL LOW (ref 3.5–5.0)
Alkaline Phosphatase: 45 U/L (ref 38–126)
Anion gap: 7 (ref 5–15)
BUN: 6 mg/dL — ABNORMAL LOW (ref 8–23)
CO2: 24 mmol/L (ref 22–32)
Calcium: 7.9 mg/dL — ABNORMAL LOW (ref 8.9–10.3)
Chloride: 102 mmol/L (ref 98–111)
Creatinine, Ser: 0.54 mg/dL (ref 0.44–1.00)
GFR, Estimated: >60 mL/min (ref >60)
Glucose, Bld: 107 mg/dL — ABNORMAL HIGH (ref 70–99)
Potassium: 3.9 mmol/L (ref 3.5–5.1)
Sodium: 133 mmol/L — ABNORMAL LOW (ref 135–145)
Total Bilirubin: 0.7 mg/dL (ref 0.3–1.2)
Total Protein: 5 g/dL — ABNORMAL LOW (ref 6.5–8.1)

## 2021-06-22 LAB — CBC
HCT: 37.4 % (ref 36.0–46.0)
Hemoglobin: 12.9 g/dL (ref 12.0–15.0)
MCH: 33.2 pg (ref 26.0–34.0)
MCHC: 34.5 g/dL (ref 30.0–36.0)
MCV: 96.4 fL (ref 80.0–100.0)
Platelets: 260 10*3/uL (ref 150–400)
RBC: 3.88 MIL/uL (ref 3.87–5.11)
RDW: 12.2 % (ref 11.5–15.5)
WBC: 11.6 10*3/uL — ABNORMAL HIGH (ref 4.0–10.5)
nRBC: 0 % (ref 0.0–0.2)

## 2021-06-22 LAB — MAGNESIUM: Magnesium: 2.1 mg/dL (ref 1.7–2.4)

## 2021-06-22 LAB — AMYLASE: Amylase: 20 U/L — ABNORMAL LOW (ref 28–100)

## 2021-06-22 MED ORDER — METOPROLOL SUCCINATE ER 100 MG PO TB24
100.0000 mg | ORAL_TABLET | Freq: Every day | ORAL | Status: DC
  Administered 2021-06-22 – 2021-06-28 (×7): 100 mg via ORAL
  Filled 2021-06-22 (×3): qty 1
  Filled 2021-06-22: qty 2
  Filled 2021-06-22 (×2): qty 1
  Filled 2021-06-22: qty 2

## 2021-06-22 MED ORDER — CHLORHEXIDINE GLUCONATE CLOTH 2 % EX PADS
6.0000 | MEDICATED_PAD | Freq: Every day | CUTANEOUS | Status: DC
  Administered 2021-06-23 – 2021-06-28 (×5): 6 via TOPICAL

## 2021-06-22 MED ORDER — SODIUM CHLORIDE 0.9 % IV SOLN: INTRAVENOUS | Status: DC | PRN

## 2021-06-22 MED ORDER — HEPARIN SODIUM (PORCINE) 5000 UNIT/ML IJ SOLN
5000.0000 [IU] | Freq: Three times a day (TID) | INTRAMUSCULAR | Status: DC
  Administered 2021-06-22 – 2021-06-28 (×17): 5000 [IU] via SUBCUTANEOUS
  Filled 2021-06-22 (×18): qty 1

## 2021-06-22 MED FILL — Heparin Sodium (Porcine) Inj 1000 Unit/ML: INTRAMUSCULAR | Qty: 30 | Status: AC

## 2021-06-22 MED FILL — Sodium Chloride IV Soln 0.9%: INTRAVENOUS | Qty: 1000 | Status: AC

## 2021-06-22 NOTE — Progress Notes (Signed)
°  Progress Note    06/22/2021 8:27 AM 1 Day Post-Op  Subjective: Having abdominal pain but otherwise feeling okay  Vitals:   06/22/21 0700 06/22/21 0751  BP: 125/64   Pulse: 61   Resp: 14   Temp:  98.6 F (37 C)  SpO2: 98%     Physical Exam: Awake alert oriented Nonlabored respirations on nasal cannula Abdomen and groin incisions healing well with expected surrounding ecchymosis Right foot is cooler than left there is a monophasic dorsalis pedis signal in the right foot is sensorimotor intact skin appears well-perfused Left foot is very warm with very brisk dorsalis pedis signal and is sensorimotor intact  CBC    Component Value Date/Time   WBC 11.6 (H) 06/22/2021 0410   RBC 3.88 06/22/2021 0410   HGB 13.3 06/22/2021 0418   HCT 39.0 06/22/2021 0418   PLT 260 06/22/2021 0410   MCV 96.4 06/22/2021 0410   MCH 33.2 06/22/2021 0410   MCHC 34.5 06/22/2021 0410   RDW 12.2 06/22/2021 0410    BMET    Component Value Date/Time   NA 133 (L) 06/22/2021 0418   K 4.0 06/22/2021 0418   CL 102 06/22/2021 0410   CO2 24 06/22/2021 0410   GLUCOSE 107 (H) 06/22/2021 0410   BUN 6 (L) 06/22/2021 0410   CREATININE 0.54 06/22/2021 0410   CALCIUM 7.9 (L) 06/22/2021 0410   GFRNONAA >60 06/22/2021 0410    INR    Component Value Date/Time   INR 1.1 06/21/2021 1208     Intake/Output Summary (Last 24 hours) at 06/22/2021 0827 Last data filed at 06/22/2021 0700 Gross per 24 hour  Intake 4686.67 ml  Output 3440 ml  Net 1246.67 ml     Assessment:  66 y.o. female is s/p aortobifemoral bypass for chronic limb threatening ischemia with bilateral lower extremity rest pain  Plan: Neuro: IV pain meds overnight have mostly controlled the pain we will get her to take some pills today Pulmonary: Needs pulmonary toilet with incentive spirometry given underlying lung dysfunction but this is satting well this morning on 4 L nasal cannula CV: Hemodynamically stable, restart home dose  metoprolol GI: Abdomen is calm minimal NG output we will remove this morning and give the patient ice chips FEN: Adequate urine output overnight decrease IV fluids to 50 cc/h PPx: Subcutaneous heparin ordered for this morning continue SCDs Disposition: Can reevaluate this afternoon for transfer to 2 stepdown.  Her right foot is warm there is a monophasic signal but her left foot is certainly more well-perfused.  I discussed with the patient possibly needing evaluation of the right lower extremity and possible bypass in the near future if she becomes symptomatic but currently her foot does appear well perfused and she is not complaining of any overnight rest pain.   Dawnita Molner C. Donzetta Matters, MD [facility] Office: [phone] Pager: [phone]  06/22/2021 8:27 AM

## 2021-06-22 NOTE — Plan of Care (Signed)
Plan per MD is for arteriogram tomorrow for RLE.  Problem: Education: Goal: Knowledge of General Education information will improve Description: Including pain rating scale, medication(s)/side effects and non-pharmacologic comfort measures Outcome: Progressing   Problem: Health Behavior/Discharge Planning: Goal: Ability to manage health-related needs will improve Outcome: Progressing   Problem: Clinical Measurements: Goal: Ability to maintain clinical measurements within normal limits will improve Outcome: Progressing Goal: Will remain free from infection Outcome: Progressing Goal: Diagnostic test results will improve Outcome: Progressing Goal: Respiratory complications will improve Outcome: Progressing Goal: Cardiovascular complication will be avoided Outcome: Progressing   Problem: Activity: Goal: Risk for activity intolerance will decrease Outcome: Progressing   Problem: Nutrition: Goal: Adequate nutrition will be maintained Outcome: Progressing   Problem: Coping: Goal: Level of anxiety will decrease Outcome: Progressing   Problem: Elimination: Goal: Will not experience complications related to bowel motility Outcome: Progressing Goal: Will not experience complications related to urinary retention Outcome: Progressing   Problem: Pain Managment: Goal: General experience of comfort will improve Outcome: Progressing   Problem: Safety: Goal: Ability to remain free from injury will improve Outcome: Progressing   Problem: Skin Integrity: Goal: Risk for impaired skin integrity will decrease Outcome: Progressing

## 2021-06-22 NOTE — Progress Notes (Addendum)
Dr. Donzetta Matters notified of inability to get pulse in right foot at this time. Cool remains cool to touch and pale however no increased pain in it.  Dr. Carlis Abbott on unit at 1250 and was able to find monophasic anterior tib doppled pulse.

## 2021-06-22 NOTE — Evaluation (Signed)
Physical Therapy Evaluation Patient Details Name: Kathryn Fowler MRN: [id number] DOB: 1955/10/08 Today's Date: 06/22/2021  History of Present Illness  Pt is a 66 y.o. 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 PMH: carotid artery stenosis, HLD, HTN, osteoporosis, PAD   Clinical Impression  Pt presents with condition above and deficits mentioned below, see PT Problem List. PTA, she was independent, without an AD until recently when she began to use her mother-in-law's rollator due to lower extremity pain/numbness. Pt was working and driving. Pt lives with her husband at her mother-in-law's 1-level home with a ramped entrance as they had recently become caregivers for the mother-in-law. Pt's husband and family can assist pt and mother-in-law upon d/c. Currently, pt displays some deficits in attention, processing, and memory this morning along with deficits in balance, activity tolerance, and bil anterior tibialis strength. She required minA for all functional mobility using a RW today. She would benefit from follow-up with OPPT and continued acute PT to maximize her return to baseline.     Recommendations for follow up therapy are one component of a multi-disciplinary discharge planning process, led by the attending physician.  Recommendations may be updated based on patient status, additional functional criteria and insurance authorization.  Follow Up Recommendations Outpatient PT    Assistance Recommended at Discharge Intermittent Supervision/Assistance  Patient can return home with the following  A little help with walking and/or transfers;A little help with bathing/dressing/bathroom;Assistance with cooking/housework;Help with stairs or ramp for entrance;Assist for transportation    Equipment Recommendations Rolling walker (2 wheels)  Recommendations for Other Services       Functional  Status Assessment Patient has had a recent decline in their functional status and demonstrates the ability to make significant improvements in function in a reasonable and predictable amount of time.     Precautions / Restrictions Precautions Precautions: Fall;Other (comment) Precaution Comments: NG tube Restrictions Weight Bearing Restrictions: No      Mobility  Bed Mobility Overal bed mobility: Needs Assistance Bed Mobility: Supine to Sit     Supine to sit: Min assist, HOB elevated     General bed mobility comments: Cues to bring legs off EOB and to bring L elbow under trunk to push up from to sit up, minA to elevate trunk.    Transfers Overall transfer level: Needs assistance Equipment used: Rolling walker (2 wheels) Transfers: Sit to/from Stand, Bed to chair/wheelchair/BSC Sit to Stand: Min assist   Step pivot transfers: Min assist       General transfer comment: Cues to push up from bed, minA to power up and steady at RW. Light minA to steady and extra time to stand step to L bed > recliner.    Ambulation/Gait Ambulation/Gait assistance: Min assist Gait Distance (Feet): 5 Feet Assistive device: Rolling walker (2 wheels) Gait Pattern/deviations: Step-through pattern, Decreased stride length, Shuffle, Trunk flexed Gait velocity: reduced Gait velocity interpretation: <1.31 ft/sec, indicative of household ambulator   General Gait Details: Pt with slow, shuffling steps forward and to L to transfer bed > recliner with RW. No LOB, trunk slightly flexed. Light minA to guide and provide stability.  Stairs            Wheelchair Mobility    Modified Rankin (Stroke Patients Only)       Balance Overall balance assessment: Needs assistance Sitting-balance support: No upper extremity supported, Feet supported Sitting balance-Leahy Scale: Fair Sitting balance - Comments:  Static sitting EOB with supervision for safety.   Standing balance support: Bilateral upper  extremity supported, Reliant on assistive device for balance, During functional activity Standing balance-Leahy Scale: Poor Standing balance comment: Reliant on RW                             Pertinent Vitals/Pain Pain Assessment Pain Assessment: 0-10 Pain Score: 7  Pain Location: abdomen Pain Descriptors / Indicators: Discomfort, Grimacing, Operative site guarding Pain Intervention(s): Limited activity within patient's tolerance, Monitored during session, Repositioned    Home Living Family/patient expects to be discharged to:: Private residence Living Arrangements: Spouse/significant other;Other relatives (mother-in-law) Available Help at Discharge: Family;Available 24 hours/day Type of Home: House Home Access: Ramped entrance       Home Layout: One level Home Equipment: Rollator (4 wheels);BSC/3in1;Shower seat;Grab bars - tub/shower Additional Comments: Pt and her husband moved in with her mother-in-law recently to be caregivers for mother-in-law. Husband and his sisters can provide care for pt and mother-in-law    Prior Function Prior Level of Function : Working/employed;Driving;Independent/Modified Independent             Mobility Comments: A few months ago was ambulating without an AD, but has recently been using her mother-in-law's rollator due to lower extremity deficits prior to surgery. Denies any recent falls. ADLs Comments: Works full-time as a Teaching laboratory technician.     Hand Dominance        Extremity/Trunk Assessment   Upper Extremity Assessment Upper Extremity Assessment: Defer to OT evaluation    Lower Extremity Assessment Lower Extremity Assessment: RLE deficits/detail;LLE deficits/detail RLE Deficits / Details: +1 edema; denies numbness/tingling bil, identifies light touch with precision, reports improved from prior to surgery already; MMT of 4+ hip flexion, 5 knee extension, 4- ankle dorsiflexion LLE Deficits / Details: +1 edema; denies  numbness/tingling bil, identifies light touch with precision, reports improved from prior to surgery already; MMT of 4+ hip flexion, 5 knee extension, 4- ankle dorsiflexion    Cervical / Trunk Assessment Cervical / Trunk Assessment: Normal  Communication   Communication: No difficulties  Cognition Arousal/Alertness: Awake/alert Behavior During Therapy: WFL for tasks assessed/performed Overall Cognitive Status: No family/caregiver present to determine baseline cognitive functioning Area of Impairment: Memory, Attention, Problem solving                   Current Attention Level: Sustained Memory: Decreased short-term memory       Problem Solving: Slow processing, Difficulty sequencing, Requires verbal cues General Comments: Pt with moments of slow processing, needing cues to sequence positioning of extremities to perform bed mobility tasks. Pt self-distracts on occasion and difficult to get straight answer for home DME needs.        General Comments General comments (skin integrity, edema, etc.): VSS on 4.5L O2    Exercises     Assessment/Plan    PT Assessment Patient needs continued PT services  PT Problem List Decreased strength;Decreased activity tolerance;Decreased balance;Decreased mobility;Decreased cognition;Pain       PT Treatment Interventions DME instruction;Gait training;Stair training;Functional mobility training;Therapeutic activities;Therapeutic exercise;Balance training;Neuromuscular re-education;Cognitive remediation;Patient/family education    PT Goals (Current goals can be found in the Care Plan section)  Acute Rehab PT Goals Patient Stated Goal: to get better PT Goal Formulation: With patient/family Time For Goal Achievement: 07/06/21 Potential to Achieve Goals: Good    Frequency Min 3X/week     Co-evaluation  AM-PAC PT "6 Clicks" Mobility  Outcome Measure Help needed turning from your back to your side while in a flat  bed without using bedrails?: A Little Help needed moving from lying on your back to sitting on the side of a flat bed without using bedrails?: A Little Help needed moving to and from a bed to a chair (including a wheelchair)?: A Little Help needed standing up from a chair using your arms (e.g., wheelchair or bedside chair)?: A Little Help needed to walk in hospital room?: A Lot Help needed climbing 3-5 steps with a railing? : A Lot 6 Click Score: 16    End of Session Equipment Utilized During Treatment: Oxygen Activity Tolerance: Patient tolerated treatment well Patient left: in chair;with call bell/phone within reach;with chair alarm set;with family/visitor present Nurse Communication: Mobility status PT Visit Diagnosis: Unsteadiness on feet (R26.81);Other abnormalities of gait and mobility (R26.89);Muscle weakness (generalized) (M62.81);Difficulty in walking, not elsewhere classified (R26.2);Pain Pain - part of body:  (abdomen)    Time: 8333-8329 PT Time Calculation (min) (ACUTE ONLY): 24 min   Charges:   PT Evaluation $PT Eval Moderate Complexity: 1 Mod PT Treatments $Therapeutic Activity: 8-22 mins        Raymond Gurney, PT, DPT Acute Rehabilitation Services  Pager: [phone] Office: [phone]   Henrene Dodge Pettis 06/22/2021, 10:02 AM
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
## 2021-06-22 NOTE — Progress Notes (Signed)
°  °  Called by nurse for concern of loss of dorsalis pedis signal.  Dr. Chestine Spore evaluated was able to find an anterior tibial signal.  Given that she has not had evaluation with angiography of the distal leg we will proceed with angiography from a brachial approach tomorrow.  Possibly will need reexploration of the right groin versus femoropopliteal bypass during this hospitalization.  She will be n.p.o. past midnight.  Saahir Prude C. Randie Heinz, MD

## 2021-06-23 ENCOUNTER — Encounter (HOSPITAL_COMMUNITY)
Admission: RE | Disposition: A | Discharge status: Home / Self Care | Payer: Self-pay | Source: Home / Self Care | Attending: Vascular Surgery

## 2021-06-23 SURGERY — ABDOMINAL AORTOGRAM W/LOWER EXTREMITY
Anesthesia: LOCAL

## 2021-06-23 MED ORDER — SODIUM CHLORIDE 0.9 % IV SOLN: INTRAVENOUS | Status: DC

## 2021-06-23 MED ORDER — PANTOPRAZOLE SODIUM 40 MG PO TBEC
40.0000 mg | DELAYED_RELEASE_TABLET | Freq: Every day | ORAL | Status: DC
  Administered 2021-06-23 – 2021-06-28 (×6): 40 mg via ORAL
  Filled 2021-06-23 (×6): qty 1

## 2021-06-23 NOTE — Discharge Instructions (Signed)
 [facility]  Discharge Instructions   Open Aortic Surgery  Please refer to the following instructions for your post-procedure care. Your surgeon or Physician Assistant will discuss any changes with you.  Activity  Avoid lifting more than eight pounds (a gallon of milk) until after your first post-operative visit. You are encouraged to walk as much as you can. You can slowly return to normal activities but must avoid strenuous activity and heavy lifting until your doctor tells you it's okay. Heavy lifting can hurt the incision and cause a hernia. Avoid activities such as vacuuming or swinging a golf club. It is normal to feel tired for several weeks after your surgery. Do not drive until your doctor gives the okay and you are no longer taking prescription pain medications. It is also normal to have difficulty with sleep habits, eating and bowl movements after surgery. These will go away with time.  Bathing/Showering  Shower daily after you go home. Do not soak in a bathtub, hot tub, or swim until the incision heals.  Incision Care  Shower every day. Clean your incision with mild soap and water. Pat the area dry with a clean towel. You do not need a bandage unless otherwise instructed. Do not apply any ointments or creams to your incision. You may have skin glue on your incision. Do not peel it off. It will come off on its own in about one week. If you have staples or sutures along your incision, they will be removed at your post op appointment.  If you have groin incisions, wash the groin wounds with soap and water daily and pat dry. (No tub bath-only shower)  Then put a dry gauze or washcloth in the groin to keep this area dry to help prevent wound infection.  Do this daily and as needed.  Do not use Vaseline or neosporin on your incisions.  Only use soap and water on your incisions and then protect and keep dry.  Diet  Resume your normal diet. There are no  special food restriction following this procedure. A low fat/low cholesterol diet is recommended for all patients with vascular disease. After your aortic surgery, it's normal to feel full faster than usual and to not feel as hungry as you normally would. You will probably lose weight initially following your surgery. It's best to eat small, frequent meals over the course of the day. Call the office if you find that you are unable to eat even small meals.   In order to heal from your surgery, it is CRITICAL to get adequate nutrition. Your body requires vitamins, minerals, and protein. Vegetables are the best source of vitamins and minerals. If you have pain, you may take over-the-counter pain reliever such as acetaminophen (Tylenol). If you were prescribed a stronger pain medication, please be aware these medication can cause nausea and constipation. Prevent nausea by taking the medication with a snack or meal. Avoid constipation by drinking plenty of fluids and eating foods with a high amount of fiber, such as fruits, vegetables and grains. Take 100mg of the over-the-counter stool softener Colace twice a day as needed to help with constipation. A laxative, such as Milk of Magnesia, may be recommended for you at this time. Do not take a laxative unless your surgeon or P.A. tells you it's OK.  Do not take Tylenol if you are taking stronger pain medications (such as Percocet).  Follow Up  Our office will schedule a follow up   appointment 2-3 weeks after discharge.  Please call us immediately for any of the following conditions    .     Severe or worsening pain in your legs or feet or in your abdomen back or chest. Increased pain, redness drainage (pus) from your incision site. Increased abdominal pain, bloating, nausea, vomiting, or persistent diarrhea. Fever of 101 degrees or higher. Swelling in your leg (s).  Reduce your risk of vascular disease  Stop smoking. If you would like help, call  [facility] at 1-800-QUIT-NOW (1-800-784-8669) or [place] at 336-586-4000. Manage your cholesterol Maintain a desired weight Control your diabetes Keep your blood pressure down  If you have any questions please call the office at [phone].   

## 2021-06-23 NOTE — Progress Notes (Signed)
Patient admitted to 4E. VS are stable. CHG bath given. Pt is A/O x4. Pt oriented to room and call light in reach.  Brooke Pace, RN 06/23/21 6:45 PM

## 2021-06-23 NOTE — Progress Notes (Signed)
°  Progress Note    06/23/2021 8:02 AM 2 Days Post-Op  Subjective: Both feet having tingling but both are feeling much better this morning  Vitals:   06/23/21 0700 06/23/21 0715  BP:  (!) 176/68  Pulse: 84 87  Resp: 19 (!) 22  Temp:  98.8 F (37.1 C)  SpO2: 96% 93%    Physical Exam: Awake alert and oriented Nonlabored respirations on nasal cannula Abdominal and groin incisions with stable ecchymosis, abdomen is soft Right foot much warmer this morning with very strong dorsalis pedis signal which can be traced to the first web interspace Left foot palpable dorsalis pedis pulse  CBC    Component Value Date/Time   WBC 11.6 (H) 06/22/2021 0410   RBC 3.88 06/22/2021 0410   HGB 13.3 06/22/2021 0418   HCT 39.0 06/22/2021 0418   PLT 260 06/22/2021 0410   MCV 96.4 06/22/2021 0410   MCH 33.2 06/22/2021 0410   MCHC 34.5 06/22/2021 0410   RDW 12.2 06/22/2021 0410    BMET    Component Value Date/Time   NA 133 (L) 06/22/2021 0418   K 4.0 06/22/2021 0418   CL 102 06/22/2021 0410   CO2 24 06/22/2021 0410   GLUCOSE 107 (H) 06/22/2021 0410   BUN 6 (L) 06/22/2021 0410   CREATININE 0.54 06/22/2021 0410   CALCIUM 7.9 (L) 06/22/2021 0410   GFRNONAA >60 06/22/2021 0410    INR    Component Value Date/Time   INR 1.1 06/21/2021 1208     Intake/Output Summary (Last 24 hours) at 06/23/2021 0802 Last data filed at 06/23/2021 0700 Gross per 24 hour  Intake 827.06 ml  Output 575 ml  Net 252.06 ml       Assessment:  66 y.o. female is s/p aortobifemoral bypass for chronic limb threatening ischemia with bilateral lower extremity rest pain   Plan: Neuro: transition to po meds Pulmonary: ISS CV: hd stable, home dose metoprolol GI: advance to clears FEN: recheck labs tomorrow a.m. PPx: Subcutaneous heparin  Disposition: transfer to floor  Cayne Yom C. Donzetta Matters, MD [facility] Office: [phone] Pager: [phone]  06/23/2021 8:02 AM

## 2021-06-23 NOTE — Progress Notes (Signed)
Physical Therapy Treatment Patient Details Name: Kathryn Fowler MRN: [id number] DOB: 1955/10/08 Today's Date: 06/23/2021   History of Present Illness Pt is a 66 y.o. 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 PMH: carotid artery stenosis, HLD, HTN, osteoporosis, PAD    PT Comments    Pt was able to progress to ambulating up to ~140 ft with a RW at a min guard level today. However, she continues to ambulate slowly and with a slightly flexed posture due to abdominal pain. Extended period of time once standing to complete coughing fit, needing cues to splint abdomen by hugging pillow to manage pain. Pt would benefit from further education on abdominal precautions with bed mobility for pain management and to encourage increased independence as she is still requiring minA at this time. Will continue to follow acutely. Current recommendations remain appropriate.    Recommendations for follow up therapy are one component of a multi-disciplinary discharge planning process, led by the attending physician.  Recommendations may be updated based on patient status, additional functional criteria and insurance authorization.  Follow Up Recommendations  Outpatient PT     Assistance Recommended at Discharge Intermittent Supervision/Assistance  Patient can return home with the following A little help with walking and/or transfers;A little help with bathing/dressing/bathroom;Assistance with cooking/housework;Help with stairs or ramp for entrance;Assist for transportation   Equipment Recommendations  Rolling walker (2 wheels)    Recommendations for Other Services       Precautions / Restrictions Precautions Precautions: Fall;Other (comment) Precaution Comments: monitor SpO2 Restrictions Weight Bearing Restrictions: No     Mobility  Bed Mobility Overal bed mobility: Needs Assistance Bed Mobility:  Rolling, Sidelying to Sit, Sit to Sidelying Rolling: Min guard Sidelying to sit: Min assist, HOB elevated     Sit to sidelying: Min assist, HOB elevated General bed mobility comments: Cues to roll to reduce abdomen pain, min guard and extra time. MinA with pt requesting to pull up  on PT to elevate trunk to sit EOB. MinA to lift legs onto bed after cuing pt to lean laterally onto elbow to control trunk with descent to supine.    Transfers Overall transfer level: Needs assistance Equipment used: Rolling walker (2 wheels) Transfers: Sit to/from Stand Sit to Stand: Min assist           General transfer comment: MinA to power up to stand and steady coming to stand from EOB 1x and commode 1x with grab bars in bathroom.    Ambulation/Gait Ambulation/Gait assistance: Min guard Gait Distance (Feet): 140 Feet (x2 bouts of ~25 ft > ~140 ft) Assistive device: Rolling walker (2 wheels) Gait Pattern/deviations: Step-through pattern, Decreased stride length, Shuffle, Trunk flexed Gait velocity: reduced Gait velocity interpretation: <1.31 ft/sec, indicative of household ambulator   General Gait Details: Pt with slow, shuffling steps with RW. No LOB, trunk slightly flexed, providing tactile and verbal cues to relax shoulders and adduct scapulas to correct momentarily.   Stairs             Wheelchair Mobility    Modified Rankin (Stroke Patients Only)       Balance Overall balance assessment: Needs assistance Sitting-balance support: No upper extremity supported, Feet supported Sitting balance-Leahy Scale: Fair Sitting balance - Comments: Static sitting EOB with supervision for safety.   Standing balance support: Bilateral upper extremity supported, Reliant on assistive device for balance, During functional activity, Single extremity supported Standing balance-Leahy Scale: Poor  Standing balance comment: Reliant on UE support, but does rotate trunk to shift weight and reach mod  off BOS with 1 UE without LOB                            Cognition Arousal/Alertness: Awake/alert Behavior During Therapy: WFL for tasks assessed/performed Overall Cognitive Status: No family/caregiver present to determine baseline cognitive functioning Area of Impairment: Memory, Attention, Problem solving, Awareness                   Current Attention Level: Sustained Memory: Decreased short-term memory     Awareness: Emergent Problem Solving: Slow processing General Comments: Pt with moments of slow processing, more focused on tasks or desires of her own than following cues.        Exercises General Exercises - Lower Extremity Mini-Sqauts: AROM, Strengthening, Both, 10 reps, Standing (with RW)    General Comments General comments (skin integrity, edema, etc.): poor waveform to read SpO2, but good waveform one time at as low as 84% on 4L      Pertinent Vitals/Pain Pain Assessment Pain Assessment: Faces Faces Pain Scale: Hurts even more Pain Location: abdomen Pain Descriptors / Indicators: Discomfort, Grimacing, Operative site guarding Pain Intervention(s): Monitored during session, Limited activity within patient's tolerance, Repositioned    Home Living                          Prior Function            PT Goals (current goals can now be found in the care plan section) Acute Rehab PT Goals Patient Stated Goal: to get better PT Goal Formulation: With patient Time For Goal Achievement: 07/06/21 Potential to Achieve Goals: Good Progress towards PT goals: Progressing toward goals    Frequency    Min 3X/week      PT Plan Current plan remains appropriate    Co-evaluation              AM-PAC PT "6 Clicks" Mobility   Outcome Measure  Help needed turning from your back to your side while in a flat bed without using bedrails?: A Little Help needed moving from lying on your back to sitting on the side of a flat bed without  using bedrails?: A Little Help needed moving to and from a bed to a chair (including a wheelchair)?: A Little Help needed standing up from a chair using your arms (e.g., wheelchair or bedside chair)?: A Little Help needed to walk in hospital room?: A Little Help needed climbing 3-5 steps with a railing? : A Lot 6 Click Score: 17    End of Session Equipment Utilized During Treatment: Oxygen Activity Tolerance: Patient tolerated treatment well Patient left: with call bell/phone within reach;in bed;with bed alarm set   PT Visit Diagnosis: Unsteadiness on feet (R26.81);Other abnormalities of gait and mobility (R26.89);Muscle weakness (generalized) (M62.81);Difficulty in walking, not elsewhere classified (R26.2);Pain Pain - part of body:  (abdomen)     Time: 9563-8756 PT Time Calculation (min) (ACUTE ONLY): 29 min  Charges:  $Gait Training: 8-22 mins $Therapeutic Activity: 8-22 mins                     Raymond Gurney, PT, DPT Acute Rehabilitation Services  Pager: [phone] Office: [phone]    Jewel Baize 06/23/2021, 4:46 PM
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
## 2021-06-24 LAB — CBC
HCT: 34.9 % — ABNORMAL LOW (ref 36.0–46.0)
Hemoglobin: 11.5 g/dL — ABNORMAL LOW (ref 12.0–15.0)
MCH: 33 pg (ref 26.0–34.0)
MCHC: 33 g/dL (ref 30.0–36.0)
MCV: 100 fL (ref 80.0–100.0)
Platelets: 230 10*3/uL (ref 150–400)
RBC: 3.49 MIL/uL — ABNORMAL LOW (ref 3.87–5.11)
RDW: 12.1 % (ref 11.5–15.5)
WBC: 10 10*3/uL (ref 4.0–10.5)
nRBC: 0 % (ref 0.0–0.2)

## 2021-06-24 LAB — BASIC METABOLIC PANEL
Anion gap: 7 (ref 5–15)
BUN: 6 mg/dL — ABNORMAL LOW (ref 8–23)
CO2: 24 mmol/L (ref 22–32)
Calcium: 8.5 mg/dL — ABNORMAL LOW (ref 8.9–10.3)
Chloride: 98 mmol/L (ref 98–111)
Creatinine, Ser: 0.6 mg/dL (ref 0.44–1.00)
GFR, Estimated: >60 mL/min (ref >60)
Glucose, Bld: 125 mg/dL — ABNORMAL HIGH (ref 70–99)
Potassium: 3.5 mmol/L (ref 3.5–5.1)
Sodium: 129 mmol/L — ABNORMAL LOW (ref 135–145)

## 2021-06-24 MED ORDER — MORPHINE SULFATE (PF) 2 MG/ML IV SOLN: 2.0000 mg | INTRAVENOUS | Status: DC | PRN

## 2021-06-24 MED ORDER — LOSARTAN POTASSIUM 50 MG PO TABS
100.0000 mg | ORAL_TABLET | Freq: Every day | ORAL | Status: DC
  Administered 2021-06-24 – 2021-06-28 (×5): 100 mg via ORAL
  Filled 2021-06-24 (×5): qty 2

## 2021-06-24 NOTE — Progress Notes (Signed)
Occupational Therapy Treatment Patient Details Name: Kathryn Fowler MRN: [id number] DOB: 1955-07-31 Today's Date: 06/24/2021   History of present illness Pt is a 66 y.o. 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 PMH: carotid artery stenosis, HLD, HTN, osteoporosis, PAD   OT comments  Pt able to cross L LE but not R LE at this time. Pt reports that she thinks Peyton Najjar (spouse) can help her. Pt does not have funds for reacher. Recommendation for HHOT.    Recommendations for follow up therapy are one component of a multi-disciplinary discharge planning process, led by the attending physician.  Recommendations Kathryn be updated based on patient status, additional functional criteria and insurance authorization.    Follow Up Recommendations  Home health OT    Assistance Recommended at Discharge Set up Supervision/Assistance  Patient can return home with the following  A little help with walking and/or transfers;A little help with bathing/dressing/bathroom;Assistance with cooking/housework;Assistance with feeding;Assist for transportation   Equipment Recommendations  BSC/3in1;Other (comment)    Recommendations for Other Services Rehab consult    Precautions / Restrictions Precautions Precautions: Fall;Other (comment) Precaution Comments: monitor SpO2, abdominal precautions for comfort Restrictions Weight Bearing Restrictions: No       Mobility Bed Mobility Overal bed mobility: Needs Assistance   Rolling: Min guard              Transfers Overall transfer level: Needs assistance   Transfers: Sit to/from Stand Sit to Stand: Supervision                 Balance                                           ADL either performed or assessed with clinical judgement   ADL Overall ADL's : Needs assistance/impaired Eating/Feeding: Set up;Sitting   Grooming:  Modified independent;Sitting                   Toilet Transfer: Min guard;Ambulation   Toileting- Clothing Manipulation and Hygiene: Min guard;Sit to/from stand       Functional mobility during ADLs: Min guard      Extremity/Trunk Assessment              Vision       Perception     Praxis      Cognition Arousal/Alertness: Awake/alert Behavior During Therapy: WFL for tasks assessed/performed Overall Cognitive Status: Impaired/Different from baseline Area of Impairment: Awareness, Problem solving                           Awareness: Emergent Problem Solving: Slow processing General Comments: pt states "i can't remember shit"        Exercises      Shoulder Instructions       General Comments RA on arrival stable vss    Pertinent Vitals/ Pain       Pain Assessment Pain Assessment: Faces Faces Pain Scale: Hurts little more Pain Location: abdomen Pain Descriptors / Indicators: Discomfort, Grimacing, Operative site guarding Pain Intervention(s): Limited activity within patient's tolerance  Home Living  Prior Functioning/Environment              Frequency  Min 2X/week        Progress Toward Goals  OT Goals(current goals can now be found in the care plan section)  Progress towards OT goals: Progressing toward goals  Acute Rehab OT Goals Patient Stated Goal: to stay a while longer because she cant go home and take care of wilma OT Goal Formulation: With patient Time For Goal Achievement: 07/06/21 Potential to Achieve Goals: Good ADL Goals Pt Will Perform Grooming: with modified independence;standing Pt Will Perform Upper Body Bathing: with modified independence;with adaptive equipment;sitting Pt Will Perform Lower Body Bathing: with modified independence;with adaptive equipment;sit to/from stand Pt Will Transfer to Toilet: with modified  independence;ambulating;regular height toilet Additional ADL Goal #1: pt will complete bed mobility mod I as precursor to adls. Additional ADL Goal #2: pt will complete basic transfer mod I with RW as precursor to adls  Plan Discharge plan remains appropriate    Co-evaluation                 AM-PAC OT "6 Clicks" Daily Activity     Outcome Measure   Help from another person eating meals?: A Little Help from another person taking care of personal grooming?: A Little Help from another person toileting, which includes using toliet, bedpan, or urinal?: A Lot Help from another person bathing (including washing, rinsing, drying)?: A Lot Help from another person to put on and taking off regular upper body clothing?: A Lot Help from another person to put on and taking off regular lower body clothing?: A Lot 6 Click Score: 14    End of Session    OT Visit Diagnosis: Unsteadiness on feet (R26.81);Muscle weakness (generalized) (M62.81)   Activity Tolerance Patient tolerated treatment well   Patient Left in bed;with call bell/phone within reach   Nurse Communication Mobility status;Precautions        Time: 2831-5176 OT Time Calculation (min): 15 min  Charges: OT General Charges $OT Visit: 1 Visit OT Treatments $Self Care/Home Management : 8-22 mins   Brynn, OTR/L  Acute Rehabilitation Services Pager: [phone] Office: [phone] .   Mateo Flow 06/24/2021, 4:40 PM

## 2021-06-24 NOTE — Progress Notes (Signed)
Physical Therapy Treatment Patient Details Name: Kathryn Fowler MRN: [id number] DOB: 1955-08-06 Today's Date: 06/24/2021   History of Present Illness Pt is a 66 y.o. 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 PMH: carotid artery stenosis, HLD, HTN, osteoporosis, PAD    PT Comments    Pt is continuing to make good, steady progress with mobility. Focused session on training pt on abdominal precautions for comfort with bed mobility and advancing mobility tolerance. Pt able to progress to performing bed mobility with min guard assist with HOB elevated. Pt also able to ambulate without UE support, but displays balance deficits that impact her safety, thereby benefiting from a RW at this time. Pt able to perform all transfers and ambulate at a supervision level now. Will continue to follow acutely. Current recommendations remain appropriate.    Recommendations for follow up therapy are one component of a multi-disciplinary discharge planning process, led by the attending physician.  Recommendations may be updated based on patient status, additional functional criteria and insurance authorization.  Follow Up Recommendations  Outpatient PT     Assistance Recommended at Discharge Intermittent Supervision/Assistance  Patient can return home with the following A little help with walking and/or transfers;A little help with bathing/dressing/bathroom;Assistance with cooking/housework;Help with stairs or ramp for entrance;Assist for transportation   Equipment Recommendations  Rolling walker (2 wheels)    Recommendations for Other Services       Precautions / Restrictions Precautions Precautions: Fall;Other (comment) Precaution Comments: monitor SpO2, abdominal precautions for comfort Restrictions Weight Bearing Restrictions: No     Mobility  Bed Mobility Overal bed mobility: Needs  Assistance Bed Mobility: Rolling, Sidelying to Sit Rolling: Min guard Sidelying to sit: HOB elevated, Min guard       General bed mobility comments: Cues to roll to reduce abdomen pain, min guard and extra time. Extra time to get elbow under trunk to push up to ascend trunk to sit EOB, HOB elevated, min guard    Transfers Overall transfer level: Needs assistance Equipment used: Rolling walker (2 wheels) Transfers: Sit to/from Stand Sit to Stand: Supervision           General transfer comment: Pt able to come to stand from EOB 1x and from low commode 1x without LOB, supervision for safety    Ambulation/Gait Ambulation/Gait assistance: Supervision Gait Distance (Feet): 210 Feet (x2 bouts of ~20 ft > ~210 ft) Assistive device: Rolling walker (2 wheels), None Gait Pattern/deviations: Step-through pattern, Decreased stride length, Trunk flexed Gait velocity: reduced Gait velocity interpretation: <1.8 ft/sec, indicate of risk for recurrent falls   General Gait Details: Pt with slow gait and slightly flexed trunk. Steady with RW even when turning head, but also able to walk without UE support but displayed increased trunk sway, but no LOB. Supervision for safety.   Stairs             Wheelchair Mobility    Modified Rankin (Stroke Patients Only)       Balance Overall balance assessment: Needs assistance Sitting-balance support: No upper extremity supported, Feet supported Sitting balance-Leahy Scale: Fair Sitting balance - Comments: Static sitting EOB with supervision for safety.   Standing balance support: Bilateral upper extremity supported, During functional activity, No upper extremity supported Standing balance-Leahy Scale: Fair Standing balance comment: Able to ambulate without UE support but slightly increased instability, but no LOB  Cognition Arousal/Alertness: Awake/alert Behavior During Therapy: WFL for tasks  assessed/performed Overall Cognitive Status: No family/caregiver present to determine baseline cognitive functioning Area of Impairment: Problem solving                             Problem Solving: Slow processing General Comments: Pt with moments of slow processing        Exercises      General Comments General comments (skin integrity, edema, etc.): SpO2 down to 93% on RA at rest, 85% on RA with gait, 93% on 2L O2      Pertinent Vitals/Pain Pain Assessment Pain Assessment: Faces Faces Pain Scale: Hurts little more Pain Location: abdomen Pain Descriptors / Indicators: Discomfort, Grimacing, Operative site guarding Pain Intervention(s): Limited activity within patient's tolerance, Monitored during session, Repositioned    Home Living                          Prior Function            PT Goals (current goals can now be found in the care plan section) Acute Rehab PT Goals Patient Stated Goal: to get better PT Goal Formulation: With patient Time For Goal Achievement: 07/06/21 Potential to Achieve Goals: Good Progress towards PT goals: Progressing toward goals    Frequency    Min 3X/week      PT Plan Current plan remains appropriate    Co-evaluation              AM-PAC PT "6 Clicks" Mobility   Outcome Measure  Help needed turning from your back to your side while in a flat bed without using bedrails?: A Little Help needed moving from lying on your back to sitting on the side of a flat bed without using bedrails?: A Little Help needed moving to and from a bed to a chair (including a wheelchair)?: A Little Help needed standing up from a chair using your arms (e.g., wheelchair or bedside chair)?: A Little Help needed to walk in hospital room?: A Little Help needed climbing 3-5 steps with a railing? : A Little 6 Click Score: 18    End of Session Equipment Utilized During Treatment: Oxygen Activity Tolerance: Patient tolerated  treatment well Patient left: with call bell/phone within reach;in chair   PT Visit Diagnosis: Unsteadiness on feet (R26.81);Other abnormalities of gait and mobility (R26.89);Muscle weakness (generalized) (M62.81);Difficulty in walking, not elsewhere classified (R26.2);Pain Pain - part of body:  (abdomen)     Time: 1749-4496 PT Time Calculation (min) (ACUTE ONLY): 18 min  Charges:  $Therapeutic Activity: 8-22 mins                     Raymond Gurney, PT, DPT Acute Rehabilitation Services  Pager: [phone] Office: [phone]    Jewel Baize 06/24/2021, 1:38 PM

## 2021-06-24 NOTE — Progress Notes (Addendum)
°  ° ° °  Subjective  - Doing better.  She states her right heel hurts.   Objective 137/66 97 97.9 F (36.6 C) (Oral) 16 94%  Intake/Output Summary (Last 24 hours) at 06/24/2021 0826 Last data filed at 06/24/2021 0737 Gross per 24 hour  Intake 489.93 ml  Output 100 ml  Net 389.93 ml    Abdomin incision healing well, B groins soft healing well Doppler brisk DP B and left PT intact.  B feet warm with cap refill<3 sec.  Left DP palpable. Hell on right without skin breakdown Lungs non labored breathing   Assessment/Planning: POD # 3  66 y.o. female is s/p aortobifemoral bypass for chronic limb threatening ischemia with bilateral lower extremity rest pain  Tolerating clear liquids without N/V.  Patient states no flatus or BM to date.  Will continue clears for now. No new labs patient ambulating in room with stand by assistance asymptomatic for dizziness or weakness. Low urine OP 06/23/21 total 635.  Will recheck Bmet and CBC.  Encourage PO fluids.  Previous Cr WNL, HGB has been stable.   Improved arterial flow to right LE.  No need for Endovascular intervention at this time.       Kathryn Fowler 06/24/2021 8:26 AM --  Laboratory Lab Results: Recent Labs    06/21/21 1208 06/22/21 0410 06/22/21 0418  WBC 16.2* 11.6*  --   HGB 13.1 12.9 13.3  HCT 39.7 37.4 39.0  PLT 280 260  --    BMET Recent Labs    06/21/21 1208 06/22/21 0410 06/22/21 0418  NA 129* 133* 133*  K 4.1 3.9 4.0  CL 99 102  --   CO2 21* 24  --   GLUCOSE 173* 107*  --   BUN 8 6*  --   CREATININE 0.63 0.54  --   CALCIUM 8.2* 7.9*  --     COAG Lab Results  Component Value Date   INR 1.1 06/21/2021   INR 0.9 06/17/2021   No results found for: PTT  I have independently interviewed and examined patient and agree with PA assessment and plan above.  Left DP is once again palpable and right foot is very warm with strong dorsalis pedis signal stress to the first rib interspace.  I have  encouraged out of bed and incentive spirometry.  She has decreased to 2 L nasal cannula.  I will advance her diet as tolerated  Debara Kamphuis C. Donzetta Matters, MD [facility] Office: [phone] Pager: [phone]

## 2021-06-24 NOTE — Progress Notes (Signed)
SATURATION QUALIFICATIONS: (This note is used to comply with regulatory documentation for home oxygen)  Patient Saturations on Room Air at Rest = 93%  Patient Saturations on Room Air while Ambulating = 85%  Patient Saturations on 2 Liters of oxygen while Ambulating = 93%  Please briefly explain why patient needs home oxygen: Pt's SpO2 decreases with mobility without supplemental O2.  Raymond Gurney, PT, DPT Acute Rehabilitation Services  Pager: [phone] Office: [phone]

## 2021-06-25 MED ORDER — NICOTINE 14 MG/24HR TD PT24
14.0000 mg | MEDICATED_PATCH | Freq: Every day | TRANSDERMAL | Status: DC
  Administered 2021-06-25 – 2021-06-28 (×4): 14 mg via TRANSDERMAL
  Filled 2021-06-25 (×4): qty 1

## 2021-06-25 MED ORDER — BISACODYL 10 MG RE SUPP
10.0000 mg | Freq: Once | RECTAL | Status: AC
  Administered 2021-06-25: 10 mg via RECTAL
  Filled 2021-06-25: qty 1

## 2021-06-25 NOTE — Plan of Care (Signed)
  Problem: Health Behavior/Discharge Planning: Goal: Ability to manage health-related needs will improve Outcome: Progressing   Problem: Clinical Measurements: Goal: Will remain free from infection Outcome: Progressing Goal: Diagnostic test results will improve Outcome: Progressing Goal: Respiratory complications will improve Outcome: Progressing Goal: Cardiovascular complication will be avoided Outcome: Progressing   

## 2021-06-25 NOTE — Progress Notes (Addendum)
°  Progress Note    06/25/2021 8:06 AM 4 Days Post-Op  Subjective:  says she is passing gas but no BM.  Discussed smoking cessation.  She says that her daddy couldn't quit smoking and had amputation. Wants to put aloe from aloe plant on her incisions.   Afebrile HR 60's-80's NSR 120's-130's systolic 95% [place]  Vitals:   06/25/21 0058 06/25/21 0334  BP: 120/69 132/66  Pulse: 70 77  Resp: 14 14  Temp: 97.7 F (36.5 C) 98 F (36.7 C)  SpO2: 96% 95%    Physical Exam: Cardiac:  regular Lungs:  non labored Incisions:  midline incision is healing nicely.  Bilateral groin incisions are healing with mild bloody drainage from left groin  Extremities:  brisk right DP doppler signal and palpable left DP pulse Abdomen:  mildly tender to palpation; +flatus; -BM  CBC    Component Value Date/Time   WBC 10.0 06/24/2021 0823   RBC 3.49 (L) 06/24/2021 0823   HGB 11.5 (L) 06/24/2021 0823   HCT 34.9 (L) 06/24/2021 0823   PLT 230 06/24/2021 0823   MCV 100.0 06/24/2021 0823   MCH 33.0 06/24/2021 0823   MCHC 33.0 06/24/2021 0823   RDW 12.1 06/24/2021 0823    BMET    Component Value Date/Time   NA 129 (L) 06/24/2021 0823   K 3.5 06/24/2021 0823   CL 98 06/24/2021 0823   CO2 24 06/24/2021 0823   GLUCOSE 125 (H) 06/24/2021 0823   BUN 6 (L) 06/24/2021 0823   CREATININE 0.60 06/24/2021 0823   CALCIUM 8.5 (L) 06/24/2021 0823   GFRNONAA >60 06/24/2021 0823    INR    Component Value Date/Time   INR 1.1 06/21/2021 1208     Intake/Output Summary (Last 24 hours) at 06/25/2021 0806 Last data filed at 06/25/2021 1696 Gross per 24 hour  Intake 240 ml  Output --  Net 240 ml     Assessment/Plan:  66 y.o. female is s/p:  Aortobifemoral bypass grafting for CLI  4 Days Post-Op   -pt with brisk right DP doppler signal and palpable left DP pulse.  -hemodynamically stable with systolic 120's-130's -tolerating clears and has had flatus but no BM-will give suppository today -mild bloody  drainage from left groin-dry gauze placed.  Discussed with pt not to put anything on her incisions except soap and water.  -discussed importance of smoking cessation.  Nicotine patch ordered (14mg  as she has not smoked for at least 4 days). -DVT prophylaxis:  sq heparin -mobilize today    , PA-C [facility] [phone] 06/25/2021 8:06 AM  VASCULAR STAFF ADDENDUM: I have independently interviewed and examined the patient. I agree with the above.  Healing well.  Small mount of serosanguineous drainage from the left groin.  Dry dressing placed.  Replace as needed. Distally, multiphasic signals in the right, palpable in the left Continue to mobilize Suppository today P.o. intake as tolerated. Please do not apply anything to the wound sites.  06/27/2021, MD [facility] Phone Number: [phone] 06/25/2021 9:01 AM

## 2021-06-25 NOTE — Plan of Care (Signed)
  Problem: Education: Goal: Knowledge of General Education information will improve Description Including pain rating scale, medication(s)/side effects and non-pharmacologic comfort measures Outcome: Progressing   

## 2021-06-26 LAB — CBC
HCT: 34.5 % — ABNORMAL LOW (ref 36.0–46.0)
Hemoglobin: 11.6 g/dL — ABNORMAL LOW (ref 12.0–15.0)
MCH: 32.6 pg (ref 26.0–34.0)
MCHC: 33.6 g/dL (ref 30.0–36.0)
MCV: 96.9 fL (ref 80.0–100.0)
Platelets: 251 10*3/uL (ref 150–400)
RBC: 3.56 MIL/uL — ABNORMAL LOW (ref 3.87–5.11)
RDW: 11.9 % (ref 11.5–15.5)
WBC: 7.2 10*3/uL (ref 4.0–10.5)
nRBC: 0 % (ref 0.0–0.2)

## 2021-06-26 LAB — BASIC METABOLIC PANEL
Anion gap: 8 (ref 5–15)
BUN: 6 mg/dL — ABNORMAL LOW (ref 8–23)
CO2: 28 mmol/L (ref 22–32)
Calcium: 8.6 mg/dL — ABNORMAL LOW (ref 8.9–10.3)
Chloride: 96 mmol/L — ABNORMAL LOW (ref 98–111)
Creatinine, Ser: 0.66 mg/dL (ref 0.44–1.00)
GFR, Estimated: >60 mL/min (ref >60)
Glucose, Bld: 104 mg/dL — ABNORMAL HIGH (ref 70–99)
Potassium: 3 mmol/L — ABNORMAL LOW (ref 3.5–5.1)
Sodium: 132 mmol/L — ABNORMAL LOW (ref 135–145)

## 2021-06-26 LAB — GLUCOSE, CAPILLARY: Glucose-Capillary: 132 mg/dL — ABNORMAL HIGH (ref 70–99)

## 2021-06-26 NOTE — Progress Notes (Signed)
°  Progress Note    06/26/2021 10:31 AM 5 Days Post-Op  Subjective:  no complaints; seen later walking in the hallway and doing well  Afebrile HR 80's-90's NSR 140's-150's systolic 100% RA  Vitals:   06/26/21 0441 06/26/21 0836  BP: (!) 159/75 (!) 148/77  Pulse: 87 94  Resp: 20 20  Temp: 98.2 F (36.8 C) 98 F (36.7 C)  SpO2: 100% 95%    Physical Exam: Cardiac:  regular Lungs:  non labored Extremities:  brisk right DP doppler signal and palpable left DP pulse  CBC    Component Value Date/Time   WBC 7.2 06/26/2021 0201   RBC 3.56 (L) 06/26/2021 0201   HGB 11.6 (L) 06/26/2021 0201   HCT 34.5 (L) 06/26/2021 0201   PLT 251 06/26/2021 0201   MCV 96.9 06/26/2021 0201   MCH 32.6 06/26/2021 0201   MCHC 33.6 06/26/2021 0201   RDW 11.9 06/26/2021 0201    BMET    Component Value Date/Time   NA 132 (L) 06/26/2021 0201   K 3.0 (L) 06/26/2021 0201   CL 96 (L) 06/26/2021 0201   CO2 28 06/26/2021 0201   GLUCOSE 104 (H) 06/26/2021 0201   BUN 6 (L) 06/26/2021 0201   CREATININE 0.66 06/26/2021 0201   CALCIUM 8.6 (L) 06/26/2021 0201   GFRNONAA >60 06/26/2021 0201    INR    Component Value Date/Time   INR 1.1 06/21/2021 1208     Intake/Output Summary (Last 24 hours) at 06/26/2021 1031 Last data filed at 06/26/2021 0053 Gross per 24 hour  Intake 0 ml  Output --  Net 0 ml      Assessment/Plan:  66 y.o. female is s/p:  Aortobifemoral bypass grafting for CLI   5 Days Post-Op   -pt continues to have brisk right DP doppler signal and palpable left DP pulse.  Walking in the hallways without difficulty.   -supp yesterday and still no BM.  Okay for enema if no BM -nicotine patch in place -continue mobilizing -DVT prophylaxis:  sq heparin -dry gauze to groin incisions  Doreatha Massed, PA-C [facility] [phone] 06/26/2021 10:31 AM  VASCULAR STAFF ADDENDUM: I have independently interviewed and examined the patient. I agree with the  above.   Fara Olden, MD [facility] Phone Number: [phone] 06/26/2021 10:31 AM

## 2021-06-26 NOTE — Plan of Care (Signed)
  Problem: Education: Goal: Knowledge of General Education information will improve Description Including pain rating scale, medication(s)/side effects and non-pharmacologic comfort measures Outcome: Progressing   

## 2021-06-26 NOTE — Plan of Care (Signed)
  Problem: Health Behavior/Discharge Planning: Goal: Ability to manage health-related needs will improve Outcome: Progressing   Problem: Clinical Measurements: Goal: Will remain free from infection Outcome: Progressing Goal: Diagnostic test results will improve Outcome: Progressing Goal: Respiratory complications will improve Outcome: Progressing   

## 2021-06-26 NOTE — Progress Notes (Signed)
°  Progress Note    06/26/2021 8:24 AM 5 Days Post-Op  Subjective:  no complaints; seen later walking in the hallway and doing well  Afebrile HR 80's-90's NSR 140's-150's systolic 100% RA  Vitals:   06/26/21 0007 06/26/21 0441  BP: 140/65 (!) 159/75  Pulse: 76 87  Resp: 17 20  Temp: 98.2 F (36.8 C) 98.2 F (36.8 C)  SpO2: 100% 100%    Physical Exam: Cardiac:  regular Lungs:  non labored Extremities:  brisk right DP doppler signal and palpable left DP pulse  CBC    Component Value Date/Time   WBC 7.2 06/26/2021 0201   RBC 3.56 (L) 06/26/2021 0201   HGB 11.6 (L) 06/26/2021 0201   HCT 34.5 (L) 06/26/2021 0201   PLT 251 06/26/2021 0201   MCV 96.9 06/26/2021 0201   MCH 32.6 06/26/2021 0201   MCHC 33.6 06/26/2021 0201   RDW 11.9 06/26/2021 0201    BMET    Component Value Date/Time   NA 132 (L) 06/26/2021 0201   K 3.0 (L) 06/26/2021 0201   CL 96 (L) 06/26/2021 0201   CO2 28 06/26/2021 0201   GLUCOSE 104 (H) 06/26/2021 0201   BUN 6 (L) 06/26/2021 0201   CREATININE 0.66 06/26/2021 0201   CALCIUM 8.6 (L) 06/26/2021 0201   GFRNONAA >60 06/26/2021 0201    INR    Component Value Date/Time   INR 1.1 06/21/2021 1208     Intake/Output Summary (Last 24 hours) at 06/26/2021 0824 Last data filed at 06/26/2021 0053 Gross per 24 hour  Intake 0 ml  Output --  Net 0 ml     Assessment/Plan:  66 y.o. female is s/p:  Aortobifemoral bypass grafting for CLI   5 Days Post-Op   -pt continues to have brisk right DP doppler signal and palpable left DP pulse.  Walking in the hallways without difficulty.   -supp yesterday and still no BM.  Okay for enema is no BM -nicotine patch in place -continue mobilizing -DVT prophylaxis:  sq heparin -dry gauze to groin incisions   Doreatha Massed, PA-C [facility] [phone] 06/26/2021 8:24 AM

## 2021-06-26 NOTE — Plan of Care (Signed)
  Problem: Health Behavior/Discharge Planning: Goal: Ability to manage health-related needs will improve Outcome: Progressing   Problem: Clinical Measurements: Goal: Will remain free from infection Outcome: Progressing Goal: Diagnostic test results will improve Outcome: Progressing Goal: Respiratory complications will improve Outcome: Progressing Goal: Cardiovascular complication will be avoided Outcome: Progressing   

## 2021-06-27 LAB — BPAM RBC
Blood Product Expiration Date: 202302232359
Blood Product Expiration Date: 202302242359
Blood Product Expiration Date: 202302242359
Blood Product Expiration Date: 202302282359
Blood Product Expiration Date: 202302282359
Blood Product Expiration Date: 202302282359
ISSUE DATE / TIME: 202302051510
ISSUE DATE / TIME: 202302162009
ISSUE DATE / TIME: 202302170826
ISSUE DATE / TIME: 202302171447
ISSUE DATE / TIME: 202302171849
Unit Type and Rh: 7300
Unit Type and Rh: 7300
Unit Type and Rh: 7300
Unit Type and Rh: 7300
Unit Type and Rh: 7300
Unit Type and Rh: 7300

## 2021-06-27 LAB — TYPE AND SCREEN
ABO/RH(D): B POS
Antibody Screen: NEGATIVE
Unit division: 0
Unit division: 0
Unit division: 0
Unit division: 0
Unit division: 0
Unit division: 0

## 2021-06-27 MED ORDER — BISACODYL 10 MG RE SUPP: 10.0000 mg | Freq: Once | RECTAL | Status: DC

## 2021-06-27 NOTE — Progress Notes (Signed)
Occupational Therapy Treatment Patient Details Name: Kathryn Fowler MRN: [id number] DOB: 10/12/1955 Today's Date: 06/27/2021   History of present illness Pt is a 66 y.o. 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 PMH: carotid artery stenosis, HLD, HTN, osteoporosis, PAD   OT comments  Pt making steady progress toward goals and d/c home with family. Pt demonstrates RA 93% with RW transfers this session. Pt reports feeling more ready to d/c home and ready to go see her cats. Pt also reports spouse has someone working on getting their new trailer put back on their land soon. Pt in good spirits with the hopes of her new home. Recommendations for HHOT for d/c. Pt wearing pullups and noted to have dry blood on L hip area. The wound was visualized and dry at this time.    Recommendations for follow up therapy are one component of a multi-disciplinary discharge planning process, led by the attending physician.  Recommendations may be updated based on patient status, additional functional criteria and insurance authorization.    Follow Up Recommendations  Home health OT    Assistance Recommended at Discharge Set up Supervision/Assistance  Patient can return home with the following  A little help with walking and/or transfers;A little help with bathing/dressing/bathroom;Assistance with cooking/housework;Assistance with feeding;Assist for transportation   Equipment Recommendations  BSC/3in1;Other (comment) (RW)    Recommendations for Other Services      Precautions / Restrictions Precautions Precautions: None       Mobility Bed Mobility Overal bed mobility: Modified Independent             General bed mobility comments: Hob 45 degrees. reports at home likely to use recliner at first    Transfers Overall transfer level: Modified independent                       Balance                                            ADL either performed or assessed with clinical judgement   ADL Overall ADL's : Needs assistance/impaired Eating/Feeding: Independent   Grooming: Modified independent;Standing               Lower Body Dressing: Supervision/safety Lower Body Dressing Details (indicate cue type and reason): plans to purchase AE- provided handout with all possible locations near her in [place] [address] listed Toilet Transfer: Supervision/safety           Functional mobility during ADLs: Rolling walker (2 wheels);Modified independent General ADL Comments: energy conservation handout provided and reviewed. Reports concerns for feeding cats in their house with getting the bowls out. Educated on reacher use as well to (A). Pt plans to sleep in recliner at home or have husband Peyton Najjar get her lots of pillows    Extremity/Trunk Assessment     Lower Extremity Assessment RLE Deficits / Details: no edema noted LLE Deficits / Details: no edema noted        Vision       Perception     Praxis      Cognition Arousal/Alertness: Awake/alert Behavior During Therapy: WFL for tasks assessed/performed Overall Cognitive Status: Within Functional Limits for tasks assessed  Exercises      Shoulder Instructions       General Comments RA 93% with activity    Pertinent Vitals/ Pain       Pain Assessment Pain Assessment: No/denies pain  Home Living                                          Prior Functioning/Environment              Frequency  Min 2X/week        Progress Toward Goals  OT Goals(current goals can now be found in the care plan section)  Progress towards OT goals: Progressing toward goals  Acute Rehab OT Goals Patient Stated Goal: to go see my cats ( [place] and icecream) OT Goal Formulation: With patient Time For Goal Achievement:  07/06/21 Potential to Achieve Goals: Good ADL Goals Pt Will Perform Grooming: with modified independence;standing Pt Will Perform Upper Body Bathing: with modified independence;with adaptive equipment;sitting Pt Will Perform Lower Body Bathing: with modified independence;with adaptive equipment;sit to/from stand Pt Will Transfer to Toilet: with modified independence;ambulating;regular height toilet Additional ADL Goal #1: pt will complete bed mobility mod I as precursor to adls. Additional ADL Goal #2: pt will complete basic transfer mod I with RW as precursor to adls  Plan Discharge plan remains appropriate    Co-evaluation                 AM-PAC OT "6 Clicks" Daily Activity     Outcome Measure   Help from another person eating meals?: None Help from another person taking care of personal grooming?: None Help from another person toileting, which includes using toliet, bedpan, or urinal?: None Help from another person bathing (including washing, rinsing, drying)?: A Little Help from another person to put on and taking off regular upper body clothing?: None Help from another person to put on and taking off regular lower body clothing?: A Little 6 Click Score: 22    End of Session Equipment Utilized During Treatment: Rolling walker (2 wheels)  OT Visit Diagnosis: Unsteadiness on feet (R26.81);Muscle weakness (generalized) (M62.81)   Activity Tolerance Patient tolerated treatment well   Patient Left in bed;with call bell/phone within reach   Nurse Communication Mobility status;Precautions        Time: 0093-8182 OT Time Calculation (min): 19 min  Charges: OT General Charges $OT Visit: 1 Visit OT Treatments $Self Care/Home Management : 8-22 mins   [place], OTR/L  Acute Rehabilitation Services Pager: [phone] Office: [phone] .   Mateo Flow 06/27/2021, 2:45 PM

## 2021-06-27 NOTE — Progress Notes (Signed)
Physical Therapy Treatment °Patient Details °Name: Kathryn Fowler °MRN: [id number] °DOB: 06/29/1955 °Today's Date: 06/27/2021 ° ° °History of Present Illness Pt is a 66 y.o. 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 PMH: carotid artery stenosis, HLD, HTN, osteoporosis, PAD ° °  °PT Comments  ° ° Pt mobilizing well in hallway and in room. Does not need any further PT at this time. Can continue to mobilize on her own or with mobility specialists. No follow up needed.    °Recommendations for follow up therapy are one component of a multi-disciplinary discharge planning process, led by the attending physician.  Recommendations may be updated based on patient status, additional functional criteria and insurance authorization. ° °Follow Up Recommendations ° No PT follow up °  °  °Assistance Recommended at Discharge PRN  °Patient can return home with the following Help with stairs or ramp for entrance;Assist for transportation °  °Equipment Recommendations ° Rolling walker (2 wheels)  °  °Recommendations for Other Services   ° ° °  °Precautions / Restrictions Precautions °Precautions: None °Restrictions °Weight Bearing Restrictions: No  °  ° °Mobility ° Bed Mobility °Overal bed mobility: Modified Independent °  °  °  °Supine to sit: Modified independent (Device/Increase time) °Sit to supine: Modified independent (Device/Increase time) °  °  °  ° °Transfers °Overall transfer level: Modified independent °Equipment used: None °Transfers: Sit to/from Stand °Sit to Stand: Modified independent (Device/Increase time) °  °  °  °  °  °  °  ° °Ambulation/Gait °Ambulation/Gait assistance: Modified independent (Device/Increase time) °Gait Distance (Feet): 300 Feet °Assistive device: Rolling walker (2 wheels), None °Gait Pattern/deviations: Step-through pattern, Decreased stride length, Trunk flexed °Gait velocity: decr °Gait velocity  interpretation: 1.31 - 2.62 ft/sec, indicative of limited community ambulator °  °General Gait Details: Pt with steady gait with walker in hallways and without device in room. Slight trunk flexion due to abd pain and long term kyphosis. ° ° °Stairs °Stairs: Yes °Stairs assistance: Supervision °Stair Management: Two rails, Step to pattern, Forwards °Number of Stairs: 3 °General stair comments: Supervision for safety ° ° °Wheelchair Mobility °  ° °Modified [place] (Stroke Patients Only) °  ° ° °  °Balance Overall balance assessment: Mild deficits observed, not formally tested °  °  °  °  °  °  °  °  °  °  °  °  °  °  °  °  °  °  °  ° °  °Cognition Arousal/Alertness: Awake/alert °Behavior During Therapy: WFL for tasks assessed/performed °Overall Cognitive Status: Within Functional Limits for tasks assessed °  °  °  °  °  °  °  °  °  °  °  °  °  °  °  °  °  °  °  ° °  °Exercises   ° °  °General Comments General comments (skin integrity, edema, etc.): SpO2 96% on RA at rest °  °  ° °Pertinent Vitals/Pain Pain Assessment °Pain Assessment: Faces °Faces Pain Scale: Hurts a little bit °Pain Location: abdomen °Pain Descriptors / Indicators: Discomfort, Operative site guarding °Pain Intervention(s): Limited activity within patient's tolerance  ° ° °Home Living   °  °  °  °  °  °  °  °  °  °   °  °Prior Function    °  °  °   ° °  PT Goals (current goals can now be found in the care plan section) Acute Rehab PT Goals Patient Stated Goal: return home Progress towards PT goals: Goals met/education completed, patient discharged from PT    Frequency           PT Plan Discharge plan needs to be updated    Co-evaluation              AM-PAC PT "6 Clicks" Mobility   Outcome Measure  Help needed turning from your back to your side while in a flat bed without using bedrails?: None Help needed moving from lying on your back to sitting on the side of a flat bed without using bedrails?: None Help needed moving to and  from a bed to a chair (including a wheelchair)?: None Help needed standing up from a chair using your arms (e.g., wheelchair or bedside chair)?: None Help needed to walk in hospital room?: None Help needed climbing 3-5 steps with a railing? : A Little 6 Click Score: 23    End of Session   Activity Tolerance: Patient tolerated treatment well Patient left: with call bell/phone within reach;in bed   PT Visit Diagnosis: Pain Pain - part of body:  (abdomen)     Time: 8502-7741 PT Time Calculation (min) (ACUTE ONLY): 13 min  Charges:  $Gait Training: 8-22 mins                     [place] Pager [phone] Office [place] 06/27/2021, 9:12 AM

## 2021-06-27 NOTE — Progress Notes (Signed)
Mobility Specialist Progress Note   06/27/21 1815  Mobility  Activity Ambulated independently in hallway  Level of Assistance Modified independent, requires aide device or extra time  Assistive Device Front wheel walker  Distance Ambulated (ft) 470 ft  Activity Response Tolerated well  $Mobility charge 1 Mobility   Received pt in doorway having no complaints and agreeable to mobility. Asymptomatic throughout ambulation, returned back to bed w/ call bell in reach and all needs met.  Holland Falling Mobility Specialist Phone Number [phone]

## 2021-06-27 NOTE — Progress Notes (Addendum)
°  Progress Note    06/27/2021 8:05 AM 6 Days Post-Op  Subjective:  not feeling well this morning, says hurting all over and just not feeling well. Says this is worse she has felt    Vitals:   06/27/21 0500 06/27/21 0621  BP: (!) 170/84 (!) 157/75  Pulse: 87   Resp:    Temp: 98.3 F (36.8 C)   SpO2: 95%    Physical Exam: Cardiac:  regular Lungs:  non labored Incisions:  B groin incisions, laparotomy incisions are clean, dry and intact without swelling or hematoma. Dry gauze applied to both groins Extremities:  well perfused and warm with doppler right AT/Dp and palpable left DP Abdomen:  non distended, expected tenderness Neurologic: alert and oriented  CBC    Component Value Date/Time   WBC 7.2 06/26/2021 0201   RBC 3.56 (L) 06/26/2021 0201   HGB 11.6 (L) 06/26/2021 0201   HCT 34.5 (L) 06/26/2021 0201   PLT 251 06/26/2021 0201   MCV 96.9 06/26/2021 0201   MCH 32.6 06/26/2021 0201   MCHC 33.6 06/26/2021 0201   RDW 11.9 06/26/2021 0201    BMET    Component Value Date/Time   NA 132 (L) 06/26/2021 0201   K 3.0 (L) 06/26/2021 0201   CL 96 (L) 06/26/2021 0201   CO2 28 06/26/2021 0201   GLUCOSE 104 (H) 06/26/2021 0201   BUN 6 (L) 06/26/2021 0201   CREATININE 0.66 06/26/2021 0201   CALCIUM 8.6 (L) 06/26/2021 0201   GFRNONAA >60 06/26/2021 0201    INR    Component Value Date/Time   INR 1.1 06/21/2021 1208     Intake/Output Summary (Last 24 hours) at 06/27/2021 0805 Last data filed at 06/26/2021 2349 Gross per 24 hour  Intake 0 ml  Output --  Net 0 ml     Assessment/Plan:  66 y.o. female is s/p aortobifemoral bypass 6 Days Post-Op   - Not feeling well this morning - afebrile. Labs have been stable - incisions all well appearing and intact - Bilateral lower extremities are well perfused and warm with palpable left Dp and doppler right DP - Minimal appetite but says this is normal - small BM yesterday. Says normal for her to have very infrequent BMs at  baseline - Continue to mobilize. PT/OT with no follow up recs - encourage IS - Anticipate d/c home in next day or so when pain better controlled   DVT prophylaxis:  sq heparin   Graceann Congress, PA-C [facility] [phone] 06/27/2021 8:05 AM  VASCULAR STAFF ADDENDUM: I have independently interviewed and examined the patient. I agree with the above.  Feels better this afternoon. Smiling.  Wants to have BM Signals unchanged in the feet.   Fara Olden, MD [facility] Phone Number: [phone] 06/27/2021 3:23 PM

## 2021-06-28 ENCOUNTER — Other Ambulatory Visit (HOSPITAL_COMMUNITY): Payer: Self-pay

## 2021-06-28 MED ORDER — OXYCODONE-ACETAMINOPHEN 5-325 MG PO TABS
1.0000 | ORAL_TABLET | ORAL | 0 refills | Status: DC | PRN
  Filled 2021-06-28: qty 20, 4d supply, fill #0

## 2021-06-28 MED ORDER — NICOTINE 14 MG/24HR TD PT24
14.0000 mg | MEDICATED_PATCH | Freq: Every day | TRANSDERMAL | 0 refills | Status: AC
Start: 2021-06-28 — End: ?
  Filled 2021-06-28: qty 28, 28d supply, fill #0

## 2021-06-28 NOTE — Progress Notes (Signed)
Patient given discharge instructions and stated understanding. 

## 2021-06-28 NOTE — Plan of Care (Signed)
°  Problem: Education: Goal: Knowledge of General Education information will improve Description: Including pain rating scale, medication(s)/side effects and non-pharmacologic comfort measures 06/28/2021 1052 by Lurline Idol, RN Outcome: Adequate for Discharge 06/28/2021 1052 by Lurline Idol, RN Outcome: Adequate for Discharge   Problem: Health Behavior/Discharge Planning: Goal: Ability to manage health-related needs will improve 06/28/2021 1052 by Lurline Idol, RN Outcome: Adequate for Discharge 06/28/2021 1052 by Lurline Idol, RN Outcome: Adequate for Discharge   Problem: Clinical Measurements: Goal: Ability to maintain clinical measurements within normal limits will improve 06/28/2021 1052 by Lurline Idol, RN Outcome: Adequate for Discharge 06/28/2021 1052 by Lurline Idol, RN Outcome: Adequate for Discharge Goal: Will remain free from infection 06/28/2021 1052 by Lurline Idol, RN Outcome: Adequate for Discharge 06/28/2021 1052 by Lurline Idol, RN Outcome: Adequate for Discharge Goal: Diagnostic test results will improve 06/28/2021 1052 by Lurline Idol, RN Outcome: Adequate for Discharge 06/28/2021 1052 by Lurline Idol, RN Outcome: Adequate for Discharge Goal: Respiratory complications will improve 06/28/2021 1052 by Lurline Idol, RN Outcome: Adequate for Discharge 06/28/2021 1052 by Lurline Idol, RN Outcome: Adequate for Discharge Goal: Cardiovascular complication will be avoided 06/28/2021 1052 by Lurline Idol, RN Outcome: Adequate for Discharge 06/28/2021 1052 by Lurline Idol, RN Outcome: Adequate for Discharge   Problem: Activity: Goal: Risk for activity intolerance will decrease 06/28/2021 1052 by Lurline Idol, RN Outcome: Adequate for Discharge 06/28/2021 1052 by Lurline Idol, RN Outcome: Adequate for Discharge   Problem: Nutrition: Goal: Adequate nutrition will be maintained 06/28/2021 1052 by Lurline Idol, RN Outcome: Adequate for  Discharge 06/28/2021 1052 by Lurline Idol, RN Outcome: Adequate for Discharge   Problem: Coping: Goal: Level of anxiety will decrease 06/28/2021 1052 by Lurline Idol, RN Outcome: Adequate for Discharge 06/28/2021 1052 by Lurline Idol, RN Outcome: Adequate for Discharge   Problem: Elimination: Goal: Will not experience complications related to bowel motility 06/28/2021 1052 by Lurline Idol, RN Outcome: Adequate for Discharge 06/28/2021 1052 by Lurline Idol, RN Outcome: Adequate for Discharge Goal: Will not experience complications related to urinary retention 06/28/2021 1052 by Lurline Idol, RN Outcome: Adequate for Discharge 06/28/2021 1052 by Lurline Idol, RN Outcome: Adequate for Discharge   Problem: Pain Managment: Goal: General experience of comfort will improve 06/28/2021 1052 by Lurline Idol, RN Outcome: Adequate for Discharge 06/28/2021 1052 by Lurline Idol, RN Outcome: Adequate for Discharge   Problem: Safety: Goal: Ability to remain free from injury will improve 06/28/2021 1052 by Lurline Idol, RN Outcome: Adequate for Discharge 06/28/2021 1052 by Lurline Idol, RN Outcome: Adequate for Discharge   Problem: Skin Integrity: Goal: Risk for impaired skin integrity will decrease 06/28/2021 1052 by Lurline Idol, RN Outcome: Adequate for Discharge 06/28/2021 1052 by Lurline Idol, RN Outcome: Adequate for Discharge   Problem: Acute Rehab OT Goals (only OT should resolve) Goal: Pt. Will Perform Grooming Outcome: Adequate for Discharge Goal: Pt. Will Perform Upper Body Bathing Outcome: Adequate for Discharge Goal: Pt. Will Perform Lower Body Bathing Outcome: Adequate for Discharge Goal: Pt. Will Transfer To Toilet Outcome: Adequate for Discharge Goal: OT Additional ADL Goal #1 Outcome: Adequate for Discharge Goal: OT Additional ADL Goal #2 Outcome: Adequate for Discharge

## 2021-06-28 NOTE — Progress Notes (Addendum)
[facility] [place]  Subjective  - Doing well and better each day.   Objective (!) 173/80 89 98.5 F (36.9 C) (Oral) 18 95%  Intake/Output Summary (Last 24 hours) at 06/28/2021 0836 Last data filed at 06/28/2021 0323 Gross per 24 hour  Intake 500 ml  Output --  Net 500 ml    Abdomin soft, incision healing well, tolerating PO's well Groin healing well without frank hematoma Doppler AT/DP, palpable left DP Lungs non labored breathing   Assessment/Planning: POD # 7   66 y.o. female is s/p aortobifemoral bypass  Good in flow good with maintained perfusion B LE Rolling walker for home in room, no PT follow up needed OT recommended for ADL's will ordered face to face Had small BM Stable disposition for discharge.    Mosetta Pigeon 06/28/2021 8:36 AM --  Laboratory Lab Results: Recent Labs    06/26/21 0201  WBC 7.2  HGB 11.6*  HCT 34.5*  PLT 251   BMET Recent Labs    06/26/21 0201  NA 132*  K 3.0*  CL 96*  CO2 28  GLUCOSE 104*  BUN 6*  CREATININE 0.66  CALCIUM 8.6*    COAG Lab Results  Component Value Date   INR 1.1 06/21/2021   INR 0.9 06/17/2021   No results found for: PTT  I have independently interviewed and examined patient and agree with PA assessment and plan above.  Feet are very well perfused with palpable left DP and right monophasic signals which are stable in both feet are warm.  She has walked in the halls.  She has had a bowel movement.  She is okay for discharge and will follow up in a few weeks for wound check.  Jaquise Faux C. Randie Heinz, MD [facility] [place] Office: [phone] Pager: [phone]

## 2021-06-28 NOTE — TOC Transition Note (Signed)
Transition of Care (TOC) - CM/SW Discharge Note Donn Pierini RN, BSN Transitions of Care Unit 4E- RN Case Manager See Treatment Team for direct phone #    Patient Details  Name: Kathryn Fowler MRN: [id number] Date of Birth: April 22, 1956  Transition of [facility]) CM/SW Contact:  Darrold Span, RN Phone Number: 06/28/2021, 10:33 AM   Clinical Narrative:    Pt medically stable for transition home today, noted order for [facility] pt had previously declined HH services- CM spoke with pt again at bedside in follow up. Per pt she does not want HH services due to current living situation- offered outpt referral for OT needs- and pt states she does not really feel she needs that at this time. PT currently not recommending any f/u. Advised pt should she change her mind about outpt OT- she can f/u with her PCP or surgeon on her f/u visit for an outpt OT referral. Pt voiced understanding.   RW has been delivered to room for home use, discussed with pt need for BSC/3n1- pt declines need for [facility]. No further DME needs noted.   Pt reports she will call her husband to come transport her home. Awaiting meds from the [facility] pharmacy    Final next level of care: Home/Self Care Barriers to Discharge: No Barriers Identified   Patient Goals and CMS Choice    NA- pt declined    Discharge Placement                 Home      Discharge Plan and Services                DME Arranged: Walker rolling DME Agency: [employer]       HH Arranged: OT, Patient Refused HH HH Agency: NA        Social Determinants of Health (SDOH) Interventions     Readmission Risk Interventions Readmission Risk Prevention Plan 06/28/2021  Post Dischage Appt Complete  Medication Screening Complete  Transportation Screening Complete  Some recent data might be hidden

## 2021-06-28 NOTE — Progress Notes (Signed)
Mobility Specialist Progress Note   06/28/21 1144  Mobility  Activity Refused mobility   Pt wants to save energy for d/c home.   Frederico Hamman Mobility Specialist Phone Number [phone]

## 2021-06-28 NOTE — Plan of Care (Signed)
Plan of care is reviewed. Pt is progressing. She is hemodynamically stable, afebrile. Pain is tolerated well. No acute distress. We will monitor.  Problem: Clinical Measurements: Goal: Will remain free from infection Outcome: Progressing   Problem: Clinical Measurements: Goal: Respiratory complications will improve Outcome: Progressing   Problem: Clinical Measurements: Goal: Cardiovascular complication will be avoided Outcome: Progressing   Problem: Activity: Goal: Risk for activity intolerance will decrease Outcome: Progressing   Problem: Nutrition: Goal: Adequate nutrition will be maintained Outcome: Progressing   Problem: Elimination: Goal: Will not experience complications related to bowel motility Outcome: Progressing   Problem: Pain Managment: Goal: General experience of comfort will improve Outcome: Progressing  Filiberto Pinks, RN

## 2021-07-03 NOTE — Discharge Summary (Signed)
[facility] Discharge Summary   Patient ID:  Kathryn Fowler MRN: [id number] DOB/AGE: September 03, 1955 66 y.o.  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  We evaluated her with CT scan she is undergoing cardiac clearance and she is indicated for aortobifemoral bypass.   Hospital Course:  Kathryn Fowler is a 66 y.o. female is S/P  Procedure(s): AORTOBIFEMORAL BYPASS GRAFT APPLICATION OF CELL SAVER Post op hemodynamically stable feet warm with monophasic right signal, left palpable pedal pulses.  RN reported loss of right LE signal 2/15, plan for angiogram.  This was cancelled on 06/23/21 with improved right inflow and signals.  She was transferred to 4E.  She continued to have good inflow.  Pain well controlled tolerating PO's without N/V.  Rolling walker for home in room, no PT follow up needed OT recommended for ADL's will ordered face to face.  Disposition stable pod# 7 for discharge home.  Significant Diagnostic Studies: CBC Lab Results  Component Value Date   WBC 7.2 06/26/2021   HGB 11.6 (L)  06/26/2021   HCT 34.5 (L) 06/26/2021   MCV 96.9 06/26/2021   PLT 251 06/26/2021    BMET    Component Value Date/Time   NA 132 (L) 06/26/2021 0201   K 3.0 (L) 06/26/2021 0201   CL 96 (L) 06/26/2021 0201   CO2 28 06/26/2021 0201   GLUCOSE 104 (H) 06/26/2021 0201   BUN 6 (L) 06/26/2021 0201   CREATININE 0.66 06/26/2021 0201   CALCIUM 8.6 (L) 06/26/2021 0201   GFRNONAA >60 06/26/2021 0201   COAG Lab Results  Component Value Date   INR 1.1 06/21/2021   INR 0.9 06/17/2021     Disposition:  Discharge to :Home Discharge Instructions     Call MD for:  redness, tenderness, or signs of infection (pain, swelling, bleeding, redness, odor or green/yellow discharge around incision site)   Complete by: As directed    Call MD for:  severe or increased pain, loss or decreased feeling  in affected limb(s)   Complete by: As directed    Call MD for:  temperature >100.5   Complete by: As directed    Resume previous diet   Complete by: As directed       Allergies as of 06/28/2021   No Known Allergies      Medication List     TAKE these medications    cilostazol 100 MG tablet Commonly known as: PLETAL Take 100 mg by mouth 2 (two) times daily.   furosemide 20 MG tablet Commonly known as: LASIX Take 20 mg by mouth daily.   ibuprofen 800 MG tablet Commonly known  as: ADVIL Take 800 mg by mouth every 8 (eight) hours as needed for mild pain or moderate pain.   losartan 100 MG tablet Commonly known as: COZAAR Take 100 mg by mouth daily.   metoprolol succinate 100 MG 24 hr tablet Commonly known as: TOPROL-XL Take 100 mg by mouth daily. Take with or immediately following a meal.   nicotine 14 mg/24hr patch Commonly known as: NICODERM CQ - dosed in mg/24 hours Place 1 patch (14 mg total) onto the skin daily.   oxyCODONE-acetaminophen 5-325 MG tablet Commonly known as: PERCOCET/ROXICET Take 1 tablet by mouth every 4 (four) hours as needed for severe pain. What changed:   how much to take additional instructions       Verbal and written Discharge instructions given to the patient. Wound care per Discharge AVS  Follow-up Information     [facility] Follow up.   Why: Rolling walker will be delivered to the room prior to transition home. Contact information: [address] [place] [address] [phone]         Maeola Harman, MD Follow up.   Specialties: Vascular Surgery, Cardiology Contact information: [address] [place] [address] [phone]                 Signed: Mosetta Pigeon 07/03/2021, 8:28 AM - For VQI Registry use --- Instructions: Press F2 to tab through selections.  Delete question if not applicable.   Post-op:  Time to Extubation: [ x] In OR, [ ]  < 12 hrs, [ ]  12-24 hrs, [ ]  >=24 hrs Vasopressors Req. Post-op: No ICU Stay: 2 days Transfusion: No  If yes, 0 units given MI: [ x] No, [ ]  Troponin only, [ ]  EKG or Clinical New Arrhythmia: No  Complications: CHF: No Resp failure: [x ] none, [ ]  Pneumonia, [ ]  Ventilator Chg in renal function: [x ] none, [ ]  Inc. Cr > 0.5, [ ]  Temp. Dialysis, [ ]  Permanent dialysis Leg ischemia: [x ] No, [ ]  Yes, no Surgery needed, [ ]  Yes, Surgery needed, [ ]  Amputation Bowel ischemia: [ x] No, [ ]  Medical Rx, [ ]  Surgical Rx Wound complication: [ ]  No, [ ]  Superficial separation/infection, [ ]  Return to OR Return to OR: No  Return to OR for bleeding: No Stroke: [x ] None, [ ]  Minor, [ ]  Major  Discharge medications: Statin use:  No  for medical reason not indicated ASA use:  No  for medical reason not indicated Plavix use:  No  for medical reason not indicaTED Beta blocker use: Yes

## 2021-07-20 ENCOUNTER — Ambulatory Visit (INDEPENDENT_AMBULATORY_CARE_PROVIDER_SITE_OTHER): Payer: BC Managed Care – PPO | Admitting: Vascular Surgery

## 2021-07-20 ENCOUNTER — Other Ambulatory Visit: Payer: Self-pay

## 2021-07-20 ENCOUNTER — Encounter: Payer: Self-pay | Admitting: Vascular Surgery

## 2021-07-20 VITALS — BP 181/83 | HR 93 | Temp 97.9°F | Resp 20 | Ht 62.0 in | Wt 129.0 lb

## 2021-07-20 DIAGNOSIS — I739 Peripheral vascular disease, unspecified: Secondary | ICD-10-CM

## 2021-07-20 MED ORDER — OXYCODONE-ACETAMINOPHEN 5-325 MG PO TABS: 1.0000 | ORAL_TABLET | ORAL | 0 refills | Status: AC | PRN

## 2021-07-20 NOTE — Progress Notes (Addendum)
?  ? ?  Subjective:  ?  ? Patient ID: Kathryn Fowler, female   DOB: 1955-08-05, 66 y.o.   MRN: [id number] ? ?HPI 66 year old female follows up after aortobifemoral bypass.  Preoperatively she had rest pain with ABI on the right 0.5 and left 0.36.  She states now that she is able to walk she still having some pain particular in the left foot with swelling.  All of her wounds have healed.  She is eating regular diet without any complications.  She does have some occasional abdominal pain. ? ? ?Review of Systems ?Left leg swelling ?   ?Objective:  ? Physical Exam ?Vitals:  ? 07/20/21 0826  ?BP: (!) 181/83  ?Pulse: 93  ?Resp: 20  ?Temp: 97.9 ?F (36.6 ?C)  ?SpO2: 95%  ? ?Awake alert oriented ?Abdominal and bilateral incisions incisions healing well ?Strong right dorsalis pedis signal, palpable left dorsalis pedis signal ?Trace edema left lower extremity ? ?   ?Assessment:  ?   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   ?Plan:  ?   ?Follow-up in 3 months with ABIs. ? ?Patient should be okay for return to work middle of May at which point she will be 3 months from her large open abdominal aortic bypass surgery.  If she is still having issues at that time we we will see her prior to releasing her to return. ?   ?Armida Vickroy C. Randie Heinz, MD ?[facility] ?Office: [phone] ?Pager: [phone] ? ?

## 2021-07-25 ENCOUNTER — Other Ambulatory Visit: Payer: Self-pay | Admitting: *Deleted

## 2021-07-25 DIAGNOSIS — I739 Peripheral vascular disease, unspecified: Secondary | ICD-10-CM

## 2021-08-25 ENCOUNTER — Telehealth: Payer: Self-pay

## 2021-08-25 NOTE — Telephone Encounter (Signed)
Returned call to pt - c/o abdominal discomfort, wt loss, constipation, pain in LE and stated "overall I just don't feel well" since aortotobifem BPG on 06/21/21. pt denied fever, redness or temperature change in LE. Spoke with Dr.Cain in office and recommended a CTA abd/pelvis in next few weeks and see PA. Explained this to pt, she agreed and verbalized understanding.  ?

## 2021-09-01 ENCOUNTER — Other Ambulatory Visit: Payer: Self-pay

## 2021-09-01 ENCOUNTER — Other Ambulatory Visit (HOSPITAL_COMMUNITY): Payer: Self-pay | Admitting: Vascular Surgery

## 2021-09-01 ENCOUNTER — Other Ambulatory Visit: Payer: Self-pay | Admitting: Vascular Surgery

## 2021-09-01 DIAGNOSIS — I739 Peripheral vascular disease, unspecified: Secondary | ICD-10-CM

## 2021-09-07 ENCOUNTER — Telehealth: Payer: Self-pay | Admitting: *Deleted

## 2021-09-07 NOTE — Telephone Encounter (Signed)
Patient states she is still having abdominal pain. She states she has only had 1 bowel movent in the last week she admits to taking pain medication. She states she spoke to her primary Dr  and he recommended she take miralax for constipation. She was asking what imaging the CT scan would show explained to patient Kathryn Fowler is a CT of her abdomen and should allow [place] to see if anything is indicated as cause of her pain. She denies any fever. Patient is scheduled for her CT on 09/12/21. Advised patient to go to ER if pain gets worse or she develops any fever. Patient verbalized understanding. ?

## 2021-09-10 ENCOUNTER — Inpatient Hospital Stay (HOSPITAL_COMMUNITY)
Admission: EM | Admit: 2021-09-10 | Discharge: 2021-09-13 | DRG: 300 | Disposition: A | Discharge status: Home / Self Care | Payer: BC Managed Care – PPO | Attending: Family Medicine | Admitting: Family Medicine

## 2021-09-10 ENCOUNTER — Emergency Department (HOSPITAL_COMMUNITY): Payer: BC Managed Care – PPO

## 2021-09-10 ENCOUNTER — Other Ambulatory Visit: Payer: Self-pay

## 2021-09-10 ENCOUNTER — Encounter (HOSPITAL_COMMUNITY): Payer: Self-pay | Admitting: Emergency Medicine

## 2021-09-10 DIAGNOSIS — I70201 Unspecified atherosclerosis of native arteries of extremities, right leg: Principal | ICD-10-CM | POA: Diagnosis present

## 2021-09-10 DIAGNOSIS — M79604 Pain in right leg: Principal | ICD-10-CM

## 2021-09-10 DIAGNOSIS — I160 Hypertensive urgency: Secondary | ICD-10-CM | POA: Diagnosis present

## 2021-09-10 DIAGNOSIS — Z79899 Other long term (current) drug therapy: Secondary | ICD-10-CM

## 2021-09-10 DIAGNOSIS — M545 Low back pain, unspecified: Secondary | ICD-10-CM | POA: Diagnosis present

## 2021-09-10 DIAGNOSIS — M549 Dorsalgia, unspecified: Secondary | ICD-10-CM

## 2021-09-10 DIAGNOSIS — J90 Pleural effusion, not elsewhere classified: Secondary | ICD-10-CM | POA: Diagnosis present

## 2021-09-10 DIAGNOSIS — M79605 Pain in left leg: Secondary | ICD-10-CM | POA: Diagnosis present

## 2021-09-10 DIAGNOSIS — E785 Hyperlipidemia, unspecified: Secondary | ICD-10-CM | POA: Diagnosis present

## 2021-09-10 DIAGNOSIS — Z87891 Personal history of nicotine dependence: Secondary | ICD-10-CM | POA: Diagnosis not present

## 2021-09-10 DIAGNOSIS — E871 Hypo-osmolality and hyponatremia: Secondary | ICD-10-CM | POA: Diagnosis present

## 2021-09-10 DIAGNOSIS — G8929 Other chronic pain: Secondary | ICD-10-CM | POA: Diagnosis present

## 2021-09-10 DIAGNOSIS — I739 Peripheral vascular disease, unspecified: Secondary | ICD-10-CM

## 2021-09-10 DIAGNOSIS — I70223 Atherosclerosis of native arteries of extremities with rest pain, bilateral legs: Secondary | ICD-10-CM | POA: Diagnosis not present

## 2021-09-10 DIAGNOSIS — M79606 Pain in leg, unspecified: Secondary | ICD-10-CM | POA: Diagnosis present

## 2021-09-10 DIAGNOSIS — I10 Essential (primary) hypertension: Secondary | ICD-10-CM | POA: Diagnosis present

## 2021-09-10 DIAGNOSIS — M810 Age-related osteoporosis without current pathological fracture: Secondary | ICD-10-CM | POA: Diagnosis present

## 2021-09-10 DIAGNOSIS — K219 Gastro-esophageal reflux disease without esophagitis: Secondary | ICD-10-CM | POA: Diagnosis present

## 2021-09-10 DIAGNOSIS — I7092 Chronic total occlusion of artery of the extremities: Secondary | ICD-10-CM | POA: Diagnosis present

## 2021-09-10 DIAGNOSIS — E876 Hypokalemia: Secondary | ICD-10-CM | POA: Diagnosis present

## 2021-09-10 DIAGNOSIS — I4891 Unspecified atrial fibrillation: Secondary | ICD-10-CM | POA: Diagnosis not present

## 2021-09-10 DIAGNOSIS — I998 Other disorder of circulatory system: Secondary | ICD-10-CM

## 2021-09-10 LAB — URINALYSIS, ROUTINE W REFLEX MICROSCOPIC
Bilirubin Urine: NEGATIVE
Glucose, UA: NEGATIVE mg/dL
Ketones, ur: NEGATIVE mg/dL
Leukocytes,Ua: NEGATIVE
Nitrite: NEGATIVE
Protein, ur: 100 mg/dL — AB
Specific Gravity, Urine: 1.01 (ref 1.005–1.030)
pH: 8 (ref 5.0–8.0)

## 2021-09-10 LAB — CBC
HCT: 40.5 % (ref 36.0–46.0)
Hemoglobin: 13.7 g/dL (ref 12.0–15.0)
MCH: 29.6 pg (ref 26.0–34.0)
MCHC: 33.8 g/dL (ref 30.0–36.0)
MCV: 87.5 fL (ref 80.0–100.0)
Platelets: 467 10*3/uL — ABNORMAL HIGH (ref 150–400)
RBC: 4.63 MIL/uL (ref 3.87–5.11)
RDW: 15.6 % — ABNORMAL HIGH (ref 11.5–15.5)
WBC: 8.5 10*3/uL (ref 4.0–10.5)
nRBC: 0 % (ref 0.0–0.2)

## 2021-09-10 LAB — TROPONIN I (HIGH SENSITIVITY)
Troponin I (High Sensitivity): 14 ng/L (ref <18)
Troponin I (High Sensitivity): 15 ng/L (ref <18)

## 2021-09-10 LAB — LIPID PANEL
Cholesterol: 249 mg/dL — ABNORMAL HIGH (ref 0–200)
HDL: 54 mg/dL (ref >40)
LDL Cholesterol: 182 mg/dL — ABNORMAL HIGH (ref 0–99)
Total CHOL/HDL Ratio: 4.6 RATIO
Triglycerides: 65 mg/dL (ref <150)
VLDL: 13 mg/dL (ref 0–40)

## 2021-09-10 LAB — COMPREHENSIVE METABOLIC PANEL
ALT: 9 U/L (ref 0–44)
AST: 18 U/L (ref 15–41)
Albumin: 3.2 g/dL — ABNORMAL LOW (ref 3.5–5.0)
Alkaline Phosphatase: 71 U/L (ref 38–126)
Anion gap: 10 (ref 5–15)
BUN: 9 mg/dL (ref 8–23)
CO2: 28 mmol/L (ref 22–32)
Calcium: 9.1 mg/dL (ref 8.9–10.3)
Chloride: 92 mmol/L — ABNORMAL LOW (ref 98–111)
Creatinine, Ser: 0.74 mg/dL (ref 0.44–1.00)
GFR, Estimated: >60 mL/min (ref >60)
Glucose, Bld: 124 mg/dL — ABNORMAL HIGH (ref 70–99)
Potassium: 3.2 mmol/L — ABNORMAL LOW (ref 3.5–5.1)
Sodium: 130 mmol/L — ABNORMAL LOW (ref 135–145)
Total Bilirubin: 1 mg/dL (ref 0.3–1.2)
Total Protein: 6.5 g/dL (ref 6.5–8.1)

## 2021-09-10 LAB — HIV ANTIBODY (ROUTINE TESTING W REFLEX): HIV Screen 4th Generation wRfx: NONREACTIVE

## 2021-09-10 LAB — LIPASE, BLOOD: Lipase: 33 U/L (ref 11–51)

## 2021-09-10 MED ORDER — ONDANSETRON HCL 4 MG PO TABS: 4.0000 mg | ORAL_TABLET | Freq: Four times a day (QID) | ORAL | Status: DC | PRN

## 2021-09-10 MED ORDER — ASPIRIN EC 81 MG PO TBEC
81.0000 mg | DELAYED_RELEASE_TABLET | Freq: Every day | ORAL | Status: DC
  Administered 2021-09-10 – 2021-09-13 (×4): 81 mg via ORAL
  Filled 2021-09-10 (×4): qty 1

## 2021-09-10 MED ORDER — MORPHINE SULFATE (PF) 4 MG/ML IV SOLN
4.0000 mg | INTRAVENOUS | Status: DC | PRN
Start: 2021-09-10 — End: 2021-09-10

## 2021-09-10 MED ORDER — CARVEDILOL 3.125 MG PO TABS
3.1250 mg | ORAL_TABLET | Freq: Two times a day (BID) | ORAL | Status: DC
  Administered 2021-09-10 – 2021-09-13 (×6): 3.125 mg via ORAL
  Filled 2021-09-10 (×7): qty 1

## 2021-09-10 MED ORDER — ENOXAPARIN SODIUM 40 MG/0.4ML IJ SOSY
40.0000 mg | PREFILLED_SYRINGE | INTRAMUSCULAR | Status: DC
  Administered 2021-09-10 – 2021-09-13 (×4): 40 mg via SUBCUTANEOUS
  Filled 2021-09-10 (×4): qty 0.4

## 2021-09-10 MED ORDER — MORPHINE SULFATE (PF) 2 MG/ML IV SOLN
4.0000 mg | INTRAVENOUS | Status: DC | PRN
  Administered 2021-09-10 – 2021-09-12 (×3): 4 mg via INTRAVENOUS
  Filled 2021-09-10 (×3): qty 2

## 2021-09-10 MED ORDER — OXYCODONE-ACETAMINOPHEN 5-325 MG PO TABS
1.0000 | ORAL_TABLET | ORAL | Status: DC | PRN
  Administered 2021-09-11 – 2021-09-13 (×4): 1 via ORAL
  Filled 2021-09-10 (×4): qty 1

## 2021-09-10 MED ORDER — LOSARTAN POTASSIUM 50 MG PO TABS
100.0000 mg | ORAL_TABLET | Freq: Every day | ORAL | Status: DC
Start: 2021-09-10 — End: 2021-09-10
  Administered 2021-09-10: 100 mg via ORAL
  Filled 2021-09-10: qty 2

## 2021-09-10 MED ORDER — ONDANSETRON HCL 4 MG/2ML IJ SOLN
4.0000 mg | Freq: Once | INTRAMUSCULAR | Status: AC
Start: 2021-09-10 — End: 2021-09-10
  Administered 2021-09-10: 4 mg via INTRAVENOUS
  Filled 2021-09-10: qty 2

## 2021-09-10 MED ORDER — POTASSIUM CHLORIDE CRYS ER 20 MEQ PO TBCR
40.0000 meq | EXTENDED_RELEASE_TABLET | Freq: Once | ORAL | Status: AC
  Administered 2021-09-10: 40 meq via ORAL
  Filled 2021-09-10: qty 2

## 2021-09-10 MED ORDER — ONDANSETRON HCL 4 MG/2ML IJ SOLN: 4.0000 mg | Freq: Four times a day (QID) | INTRAMUSCULAR | Status: DC | PRN

## 2021-09-10 MED ORDER — CARVEDILOL 3.125 MG PO TABS: 3.1250 mg | ORAL_TABLET | Freq: Two times a day (BID) | ORAL | Status: DC

## 2021-09-10 MED ORDER — HYDRALAZINE HCL 25 MG PO TABS: 25.0000 mg | ORAL_TABLET | Freq: Four times a day (QID) | ORAL | Status: DC | PRN

## 2021-09-10 MED ORDER — SODIUM CHLORIDE 0.9 % IV SOLN: INTRAVENOUS | Status: DC

## 2021-09-10 MED ORDER — METOPROLOL SUCCINATE ER 25 MG PO TB24
100.0000 mg | ORAL_TABLET | Freq: Every day | ORAL | Status: DC
  Administered 2021-09-10: 100 mg via ORAL
  Filled 2021-09-10: qty 4

## 2021-09-10 MED ORDER — FUROSEMIDE 20 MG PO TABS
20.0000 mg | ORAL_TABLET | Freq: Every day | ORAL | Status: DC
Start: 2021-09-10 — End: 2021-09-10

## 2021-09-10 MED ORDER — IOHEXOL 350 MG/ML SOLN
100.0000 mL | Freq: Once | INTRAVENOUS | Status: AC | PRN
  Administered 2021-09-10: 100 mL via INTRAVENOUS

## 2021-09-10 MED ORDER — AMLODIPINE BESYLATE 10 MG PO TABS
10.0000 mg | ORAL_TABLET | Freq: Every day | ORAL | Status: DC
  Administered 2021-09-10 – 2021-09-13 (×4): 10 mg via ORAL
  Filled 2021-09-10 (×4): qty 1

## 2021-09-10 MED ORDER — MORPHINE SULFATE (PF) 4 MG/ML IV SOLN
4.0000 mg | Freq: Once | INTRAVENOUS | Status: AC
  Administered 2021-09-10: 4 mg via INTRAVENOUS
  Filled 2021-09-10: qty 1

## 2021-09-10 MED ORDER — BISACODYL 5 MG PO TBEC
5.0000 mg | DELAYED_RELEASE_TABLET | Freq: Every day | ORAL | Status: DC | PRN
  Administered 2021-09-11 – 2021-09-13 (×2): 5 mg via ORAL
  Filled 2021-09-10 (×2): qty 1

## 2021-09-10 NOTE — Progress Notes (Signed)
Pt admitted to rm 7 from ED. CHG wipe given. Initiated tele. VSS. Oriented pt to the unit. Call bell within reach.  ? ?Lawson Radar, RN ? ?

## 2021-09-10 NOTE — Consult Note (Signed)
? ?[facility] [place] ? ?Patient name: Kathryn Fowler MRN: [id number] DOB: 01-09-1956 Sex: female ? ? ?REQUESTING PROVIDER:  ? ? ER ? ? ?REASON FOR CONSULT:  ?  ?Leg pain ? ?HISTORY OF PRESENT ILLNESS:  ? ?Kathryn Fowler is a 66 y.o. female, who presented to the emergency department on 08/11/2021 with bilateral leg pain.  The patient has a history of aortobifemoral bypass graft by Dr. Donzetta Matters on 06/21/2021.  He last saw her on 07/20/2021 and she was still having pain and swelling in her legs.  She did have a palpable left dorsalis pedis pulse and brisk right dorsalis pedis Doppler signal.  She called the office a few weeks ago complaining of abdominal pain and weight loss.  A CT scan was scheduled for this coming Monday.  She continues to have abdominal pain and back pain.  She states that her legs have not gotten any better.  She describes her pain as like a toothache with numbness.  She says she saw her primary care doctor and was concerned about back pain but he said her leg pain was not related to her back. ? ?The patient is medically managed for hypertension.  She has a history of tobacco abuse.  She should be on a statin for hypercholesterolemia. ? ?PAST MEDICAL HISTORY  ? ? ?Past Medical History:  ?Diagnosis Date  ? Arthritis   ? B12 deficiency   ? Carotid artery stenosis   ? 1-39% BICA stenosis 04/20/21 [place]  ? Dyspnea   ? Esophageal reflux   ? History of tobacco abuse   ? Hyperlipidemia   ? Hypertension   ? Hyponatremia   ? Osteoporosis   ? PAD (peripheral artery disease) ([place])   ? Polycythemia   ? ? ? ?FAMILY HISTORY  ? ?Family History  ?Problem Relation Age of Onset  ? Cancer Mother   ? ? ?SOCIAL HISTORY:  ? ?Social History  ? ?Socioeconomic History  ? Marital status: Married  ?  Spouse name: Not on file  ? Number of children: Not on file  ? Years of education: Not on file  ? Highest education level: Not on file  ?Occupational History  ? Not on file   ?Tobacco Use  ? Smoking status: Former  ?  Packs/day: 0.25  ?  Types: Cigarettes  ?  Quit date: 06/2021  ?  Years since quitting: 0.2  ? Smokeless tobacco: Not on file  ?Vaping Use  ? Vaping Use: Never used  ?Substance and Sexual Activity  ? Alcohol use: Yes  ?  Alcohol/week: 28.0 standard drinks  ?  Types: 28 Cans of beer per week  ? Drug use: Yes  ?  Types: Marijuana  ? Sexual activity: Not on file  ?Other Topics Concern  ? Not on file  ?Social History Narrative  ? Not on file  ? ?Social Determinants of Health  ? ?Financial Resource Strain: Not on file  ?Food Insecurity: Not on file  ?Transportation Needs: Not on file  ?Physical Activity: Not on file  ?Stress: Not on file  ?Social Connections: Not on file  ?Intimate Partner Violence: Not on file  ? ? ?ALLERGIES:  ? ? ?No Known Allergies ? ?CURRENT MEDICATIONS:  ? ? ?No current facility-administered medications for this encounter.  ? ?Current Outpatient Medications  ?Medication Sig Dispense Refill  ? cilostazol (PLETAL) 100 MG tablet Take 100 mg by mouth 2 (two) times daily.    ? furosemide (LASIX) 20 MG tablet  Take 20 mg by mouth daily.    ? ibuprofen (ADVIL) 800 MG tablet Take 800 mg by mouth every 8 (eight) hours as needed for mild pain or moderate pain.    ? losartan (COZAAR) 100 MG tablet Take 100 mg by mouth daily.    ? metoprolol succinate (TOPROL-XL) 100 MG 24 hr tablet Take 100 mg by mouth daily. Take with or immediately following a meal.    ? nicotine (NICODERM CQ - DOSED IN MG/24 HOURS) 14 mg/24hr patch Place 1 patch (14 mg total) onto the skin daily. (Patient not taking: Reported on 07/20/2021) 28 patch 0  ? oxyCODONE-acetaminophen (PERCOCET/ROXICET) 5-325 MG tablet Take 1 tablet by mouth every 4 (four) hours as needed for severe pain. 30 tablet 0  ? ? ?REVIEW OF SYSTEMS:  ? ?[X]  denotes positive finding, [ ]  denotes negative finding ?Cardiac  Comments:  ?Chest pain or chest pressure:    ?Shortness of breath upon exertion:    ?Short of breath when lying  flat:    ?Irregular heart rhythm:    ?    ?Vascular    ?Pain in calf, thigh, or hip brought on by ambulation:    ?Pain in feet at night that wakes you up from your sleep:     ?Blood clot in your veins:    ?Leg swelling:     ?    ?Pulmonary    ?Oxygen at home:    ?Productive cough:     ?Wheezing:     ?    ?Neurologic    ?Sudden weakness in arms or legs:     ?Sudden numbness in arms or legs:     ?Sudden onset of difficulty speaking or slurred speech:    ?Temporary loss of vision in one eye:     ?Problems with dizziness:     ?    ?Gastrointestinal    ?Blood in stool:     ? ?Vomited blood:     ?    ?Genitourinary    ?Burning when urinating:     ?Blood in urine:    ?    ?Psychiatric    ?Major depression:     ?    ?Hematologic    ?Bleeding problems:    ?Problems with blood clotting too easily:    ?    ?Skin    ?Rashes or ulcers:    ?    ?Constitutional    ?Fever or chills:    ? ?PHYSICAL EXAM:  ? ?Vitals:  ? 09/10/21 1130 09/10/21 1145 09/10/21 1156 09/10/21 1215  ?BP: (!) 228/105 (!) 174/107  (!) 206/93  ?Pulse: 87 80 75 74  ?Resp: 16 20 14 17   ?Temp:      ?TempSrc:      ?SpO2: 97%  94% 90%  ? ? ?GENERAL: The patient is a well-nourished female, in no acute distress. The vital signs are documented above. ?CARDIAC: There is a regular rate and rhythm.  ?VASCULAR: Palpable left dorsalis pedis pulse.  Biphasic right dorsalis pedis ?PULMONARY: Nonlabored respirations ?ABDOMEN: Soft and non-tender  ?MUSCULOSKELETAL: There are no major deformities or cyanosis. ?NEUROLOGIC: No focal weakness or paresthesias are detected. ?SKIN: There are no ulcers or rashes noted. ?PSYCHIATRIC: The patient has a normal affect. ? ?STUDIES:  ? ?I have reviewed the following CTA: ?1. Interval aortobifemoral bypass graft which is patent from takeoff ?to touchdown site at the femoral arteries. ?2. 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 Consider further  evaluation with CTA pelvis/runoff. ?3. 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 Mild narrowing of the bilateral renal arteries. ?4. Small bilateral effusions right greater than left with associated ?compressive atelectasis. ? ? ?ASSESSMENT and PLAN  ? ?Leg pain: This has not significantly improved after her aortobifemoral bypass graft.  I reviewed her CT scan.  The aortobifemoral graft is widely patent.  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  Her physical exam is unchanged from her 1 month postop check.  I suspect that her leg pain is multifactorial, given how she describes it.  She potentially could have degenerative lower back disease contributing to some of her neuropathic type pain.  She does state that her right leg is worse, and she has a right superficial femoral artery occlusion which could be a contributing factor.  We can consider angiography to determine revascularization options.  However she is also complaining of left leg pain and she has a palpable left dorsalis pedis pulse, so I suspect there is another etiology contributing to her pain.  I will speak with Dr. Donzetta Matters about further plans for revascularization, possibly angiography early next week. ? ? ?Annamarie Major, IV, MD, FACS ?[facility] [place] ?Tel [phone] ?Pager [phone]  ?

## 2021-09-10 NOTE — ED Triage Notes (Signed)
Pt reports generalized abd pain and nausea x 1 week.  States she has been in bed all week because she feels bad.  SOB at night.  Reports constipation.  Last BM 9 days ago.   ? ?States she had open heart surgery 2/14 and has had problems with constipation since then.  Taking Metamucil.  Hypertensive. ?

## 2021-09-10 NOTE — ED Notes (Signed)
Pt's BP remains above 200s. EDP made aware. No active orders at this time.  ?

## 2021-09-10 NOTE — H&P (Signed)
?History and Physical  ? ? ?Kathryn Fowler [id number] DOB: Aug 13, 1955 DOA: 09/10/2021 ? ?PCP: Cyndi Bender, PA-C (Confirm with patient/family/NH records and if not entered, this has to be entered at [facility] point of entry) ?Patient coming from: Home ? ?I have personally briefly reviewed patient's old medical records in [place] ? ?Chief Complaint: Both legs hurt ? ?HPI: Kathryn Fowler is a 66 y.o. female with medical history significant of severe PVD status post bilateral aorto-femoral artery bypass February 2023, HTN, hyponatremia, HLD, came with worsening of B/L groin and thigh pain. ? ?Symptoms started more than 1 month ago, Cordery getting worse, pain has been dull like, worsening with ambulation.  About 1 week ago, the pain has progressed to lower abdomen, associated with nauseous but no vomiting no diarrhea.  No fever or chills.  Patient reported that her both feet always feel cold, but denies any discoloration of any other toes.  He denied a history of A-fib. ? ?ED Course: Blood pressure elevated.  CT angiogram showed complete occlusion of the right femoral artery 1.5 cm distal to the bifurcation, recommending further CTA to evaluate.  Vascular surgery consulted. ? ?Review of Systems: As per HPI otherwise 14 point review of systems negative.  ? ?Past Medical History:  ?Diagnosis Date  ? Arthritis   ? B12 deficiency   ? Carotid artery stenosis   ? 1-39% BICA stenosis 04/20/21 [place]  ? Dyspnea   ? Esophageal reflux   ? History of tobacco abuse   ? Hyperlipidemia   ? Hypertension   ? Hyponatremia   ? Osteoporosis   ? PAD (peripheral artery disease) ([place])   ? Polycythemia   ? ? ?Past Surgical History:  ?Procedure Laterality Date  ? AORTA - BILATERAL FEMORAL ARTERY BYPASS GRAFT N/A 06/21/2021  ? Procedure: AORTOBIFEMORAL BYPASS GRAFT;  Surgeon: Waynetta Sandy, MD;  Location: [facility];  Service: Vascular;  Laterality: N/A;  ? ? ? reports that she quit smoking about 3 months ago. Her smoking use  included cigarettes. She smoked an average of .25 packs per day. She does not have any smokeless tobacco history on file. She reports current alcohol use of about 28.0 standard drinks per week. She reports current drug use. Drug: Marijuana. ? ?No Known Allergies ? ?Family History  ?Problem Relation Age of Onset  ? Cancer Mother   ? ? ? ?Prior to Admission medications   ?Medication Sig Start Date End Date Taking? Authorizing Provider  ?cilostazol (PLETAL) 100 MG tablet Take 100 mg by mouth 2 (two) times daily. 03/25/21   [provider]  ?furosemide (LASIX) 20 MG tablet Take 20 mg by mouth daily. 05/11/21   [provider]  ?ibuprofen (ADVIL) 800 MG tablet Take 800 mg by mouth every 8 (eight) hours as needed for mild pain or moderate pain.    [provider]  ?losartan (COZAAR) 100 MG tablet Take 100 mg by mouth daily.    [provider]  ?metoprolol succinate (TOPROL-XL) 100 MG 24 hr tablet Take 100 mg by mouth daily. Take with or immediately following a meal.    [provider]  ?nicotine (NICODERM CQ - DOSED IN MG/24 HOURS) 14 mg/24hr patch Place 1 patch (14 mg total) onto the skin daily. ?Patient not taking: Reported on 07/20/2021 06/28/21   Ulyses Amor, PA-C  ?oxyCODONE-acetaminophen (PERCOCET/ROXICET) 5-325 MG tablet Take 1 tablet by mouth every 4 (four) hours as needed for severe pain. 07/20/21   Waynetta Sandy, MD  ? ? ?  Physical Exam: ?Vitals:  ? 09/10/21 1145 09/10/21 1156 09/10/21 1215 09/10/21 1230  ?BP: (!) 174/107  (!) 206/93 (!) 213/94  ?Pulse: 80 75 74 77  ?Resp: 20 14 17  (!) 21  ?Temp:      ?TempSrc:      ?SpO2:  94% 90% 97%  ? ? ?Constitutional: NAD, calm, comfortable ?Vitals:  ? 09/10/21 1145 09/10/21 1156 09/10/21 1215 09/10/21 1230  ?BP: (!) 174/107  (!) 206/93 (!) 213/94  ?Pulse: 80 75 74 77  ?Resp: 20 14 17  (!) 21  ?Temp:      ?TempSrc:      ?SpO2:  94% 90% 97%  ? ?Eyes: PERRL, lids and conjunctivae normal ?ENMT: Mucous membranes are moist.  Posterior pharynx clear of any exudate or lesions.Normal dentition.  ?Neck: normal, supple, no masses, no thyromegaly ?Respiratory: clear to auscultation bilaterally, no wheezing, no crackles. Normal respiratory effort. No accessory muscle use.  ?Cardiovascular: Regular rate and rhythm, no murmurs / rubs / gallops. No extremity edema. 2+ pedal pulses. No carotid bruits.  ?Abdomen: no tenderness, no masses palpated. No hepatosplenomegaly. Bowel sounds positive.  ?Musculoskeletal: no clubbing / cyanosis. No joint deformity upper and lower extremities. Good ROM, no contractures. Normal muscle tone.  ?Skin: no rashes, lesions, ulcers. No induration ?Neurologic: CN 2-12 grossly intact. Sensation intact, DTR normal. Strength 5/5 in all 4.  ?Psychiatric: Normal judgment and insight. Alert and oriented x 3. Normal mood.  ? ? ? ?Labs on Admission: I have personally reviewed following labs and imaging studies ? ?CBC: ?Recent Labs  ?Lab 09/10/21 ?[id number]  ?WBC 8.5  ?HGB 13.7  ?HCT 40.5  ?MCV 87.5  ?PLT 467*  ? ?Basic Metabolic Panel: ?Recent Labs  ?Lab 09/10/21 ?[id number]  ?NA 130*  ?K 3.2*  ?CL 92*  ?CO2 28  ?GLUCOSE 124*  ?BUN 9  ?CREATININE 0.74  ?CALCIUM 9.1  ? ?GFR: ?CrCl cannot be calculated (Unknown ideal weight.). ?Liver Function Tests: ?Recent Labs  ?Lab 09/10/21 ?[id number]  ?AST 18  ?ALT 9  ?ALKPHOS 71  ?BILITOT 1.0  ?PROT 6.5  ?ALBUMIN 3.2*  ? ?Recent Labs  ?Lab 09/10/21 ?[id number]  ?LIPASE 33  ? ?No results for input(s): AMMONIA in the last 168 hours. ?Coagulation Profile: ?No results for input(s): INR, PROTIME in the last 168 hours. ?Cardiac Enzymes: ?No results for input(s): CKTOTAL, CKMB, CKMBINDEX, TROPONINI in the last 168 hours. ?BNP (last 3 results) ?No results for input(s): PROBNP in the last 8760 hours. ?HbA1C: ?No results for input(s): HGBA1C in the last 72 hours. ?CBG: ?No results for input(s): GLUCAP in the last 168 hours. ?Lipid Profile: ?No results for input(s): CHOL, HDL, LDLCALC, TRIG, CHOLHDL, LDLDIRECT in the last  72 hours. ?Thyroid Function Tests: ?No results for input(s): TSH, T4TOTAL, FREET4, T3FREE, THYROIDAB in the last 72 hours. ?Anemia Panel: ?No results for input(s): VITAMINB12, FOLATE, FERRITIN, TIBC, IRON, RETICCTPCT in the last 72 hours. ?Urine analysis: ?   ?Component Value Date/Time  ? [place] YELLOW 09/10/2021 0824  ? APPEARANCEUR CLEAR 09/10/2021 0824  ? LABSPEC 1.010 09/10/2021 0824  ? PHURINE 8.0 09/10/2021 0824  ? [place] NEGATIVE 09/10/2021 0824  ? HGBUR MODERATE (A) 09/10/2021 0824  ? [place] NEGATIVE 09/10/2021 0824  ? [place] NEGATIVE 09/10/2021 0824  ? PROTEINUR 100 (A) 09/10/2021 0824  ? NITRITE NEGATIVE 09/10/2021 0824  ? LEUKOCYTESUR NEGATIVE 09/10/2021 0824  ? ? ?Radiological Exams on Admission: ?DG Chest 2 View ? ?Result Date: 09/10/2021 ?CLINICAL DATA:  Shortness of breath EXAM: CHEST - 2 VIEW COMPARISON:  Chest x-ray dated June 22, 2021 FINDINGS: The heart size and mediastinal contours are within normal limits. Both lungs are clear. Blunting of the bilateral costophrenic angles. The visualized skeletal structures are unremarkable. IMPRESSION: 1. No consolidation. 2. Blunting of the bilateral costophrenic angles which may be due to pleural thickening or trace bilateral pleural effusions. Electronically Signed   By: Yetta Glassman M.D.   On: 09/10/2021 09:09  ? ?CT Angio Abd/Pel W and/or Wo Contrast ? ?Result Date: 09/10/2021 ?CLINICAL DATA:  Recent fem-pop bypass graft.  Evaluate for leak. EXAM: CTA ABDOMEN AND PELVIS WITHOUT AND WITH CONTRAST TECHNIQUE: Multidetector CT imaging of the abdomen and pelvis was performed using the standard protocol during bolus administration of intravenous contrast. Multiplanar reconstructed images and MIPs were obtained and reviewed to evaluate the vascular anatomy. RADIATION DOSE REDUCTION: This exam was performed according to the departmental dose-optimization program which includes automated exposure control, adjustment of the mA and/or kV according  to patient size and/or use of iterative reconstruction technique. CONTRAST:  16mL OMNIPAQUE IOHEXOL 350 MG/ML SOLN COMPARISON:  CTA abdominal aorta with ileal femoral runoff 04/14/2021. FINDINGS: VASCULAR

## 2021-09-10 NOTE — ED Notes (Signed)
Pt's BP over 200s. MD made aware.  ?

## 2021-09-11 ENCOUNTER — Inpatient Hospital Stay (HOSPITAL_COMMUNITY): Payer: BC Managed Care – PPO

## 2021-09-11 DIAGNOSIS — I4891 Unspecified atrial fibrillation: Secondary | ICD-10-CM

## 2021-09-11 DIAGNOSIS — I998 Other disorder of circulatory system: Secondary | ICD-10-CM | POA: Diagnosis not present

## 2021-09-11 DIAGNOSIS — I10 Essential (primary) hypertension: Secondary | ICD-10-CM

## 2021-09-11 DIAGNOSIS — J90 Pleural effusion, not elsewhere classified: Secondary | ICD-10-CM

## 2021-09-11 DIAGNOSIS — E871 Hypo-osmolality and hyponatremia: Secondary | ICD-10-CM

## 2021-09-11 LAB — ECHOCARDIOGRAM COMPLETE
AR max vel: 2.11 cm2
AV Mean grad: 7 mmHg
AV Peak grad: 10.8 mmHg
Ao pk vel: 1.64 m/s
Area-P 1/2: 4.21 cm2
Calc EF: 63.7 %
Height: 62 in
S' Lateral: 2.9 cm
Single Plane A2C EF: 65.8 %
Single Plane A4C EF: 63.9 %
Weight: 1964.74 oz

## 2021-09-11 LAB — BASIC METABOLIC PANEL
Anion gap: 10 (ref 5–15)
BUN: 12 mg/dL (ref 8–23)
CO2: 25 mmol/L (ref 22–32)
Calcium: 9.1 mg/dL (ref 8.9–10.3)
Chloride: 97 mmol/L — ABNORMAL LOW (ref 98–111)
Creatinine, Ser: 0.93 mg/dL (ref 0.44–1.00)
GFR, Estimated: >60 mL/min (ref >60)
Glucose, Bld: 97 mg/dL (ref 70–99)
Potassium: 4.3 mmol/L (ref 3.5–5.1)
Sodium: 132 mmol/L — ABNORMAL LOW (ref 135–145)

## 2021-09-11 LAB — BRAIN NATRIURETIC PEPTIDE: B Natriuretic Peptide: 172.9 pg/mL — ABNORMAL HIGH (ref 0.0–100.0)

## 2021-09-11 NOTE — Assessment & Plan Note (Signed)
Mild, follow °

## 2021-09-11 NOTE — ED Provider Notes (Signed)
?[facility] ?Provider Note ? ?CSN: [id number] ?Arrival date & time: 09/10/21 0816 ? ?Chief Complaint(s) ?Abdominal Pain ? ?HPI ?Kathryn Fowler is a 66 y.o. female with PMH severe PAD status post aortobifem bypass graft by Dr. Randie Heinzain on 06/21/2021, HTN, HLD, polycythemia who presents to the emergency department for evaluation of bilateral leg pain and abdominal pain.  Patient states that since her surgery she has had intermittent cycles of abdominal pain that last approximately 2 weeks and her leg pain has not improved since surgery.  She endorses neuropathic type symptoms to bilateral lower extremities but no associated nausea, vomiting, fever, chest pain, shortness of breath or other systemic symptoms. ? ? ?Past Medical History ?Past Medical History:  ?Diagnosis Date  ? Arthritis   ? B12 deficiency   ? Carotid artery stenosis   ? 1-39% BICA stenosis 04/20/21 [place]  ? Dyspnea   ? Esophageal reflux   ? History of tobacco abuse   ? Hyperlipidemia   ? Hypertension   ? Hyponatremia   ? Osteoporosis   ? PAD (peripheral artery disease) ([facility])   ? Polycythemia   ? ?Patient Active Problem List  ? Diagnosis Date Noted  ? Lower limb ischemia 09/10/2021  ? Limb ischemia 09/10/2021  ? PAD (peripheral artery disease) ([facility]) 06/21/2021  ? 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 ?Home Medication(s) ?Prior to Admission medications   ?Medication Sig Start Date End Date Taking? Authorizing Provider  ?cilostazol (PLETAL) 100 MG tablet Take 100 mg by mouth 2 (two) times daily. 03/25/21  Yes [provider]  ?ibuprofen (ADVIL) 800 MG tablet Take 800 mg by mouth every 8 (eight) hours as needed for mild pain or moderate pain.   Yes [provider]  ?losartan (COZAAR) 100 MG tablet Take 100 mg by mouth daily.   Yes [provider]  ?metoprolol succinate (TOPROL-XL) 100 MG 24 hr tablet Take 100 mg by mouth daily. Take with or immediately following a meal.   Yes [provider]   ?oxyCODONE-acetaminophen (PERCOCET/ROXICET) 5-325 MG tablet Take 1 tablet by mouth every 4 (four) hours as needed for severe pain. 07/20/21  Yes Maeola Harmanain, Brandon Christopher, MD  ?potassium chloride (KLOR-CON) 10 MEQ tablet Take 10 mEq by mouth daily. 08/04/21  Yes [provider]  ?furosemide (LASIX) 20 MG tablet Take 20 mg by mouth daily. 05/11/21   [provider]  ?nicotine (NICODERM CQ - DOSED IN MG/24 HOURS) 14 mg/24hr patch Place 1 patch (14 mg total) onto the skin daily. ?Patient not taking: Reported on 07/20/2021 06/28/21   Lars Mageollins, Emma M, PA-C  ?                                                                                                                                  ?Past Surgical History ?Past Surgical History:  ?Procedure Laterality Date  ? AORTA - BILATERAL FEMORAL ARTERY BYPASS GRAFT N/A 06/21/2021  ?  Procedure: AORTOBIFEMORAL BYPASS GRAFT;  Surgeon: Maeola Harman, MD;  Location: [facility] OR;  Service: Vascular;  Laterality: N/A;  ? ?Family History ?Family History  ?Problem Relation Age of Onset  ? Cancer Mother   ? ? ?Social History ?Social History  ? ?Tobacco Use  ? Smoking status: Former  ?  Packs/day: 0.25  ?  Types: Cigarettes  ?  Quit date: 06/2021  ?  Years since quitting: 0.2  ?Vaping Use  ? Vaping Use: Never used  ?Substance Use Topics  ? Alcohol use: Yes  ?  Alcohol/week: 28.0 standard drinks  ?  Types: 28 Cans of beer per week  ? Drug use: Yes  ?  Types: Marijuana  ? ?Allergies ?Patient has no known allergies. ? ?Review of Systems ?Review of Systems  ?Gastrointestinal:  Positive for abdominal pain.  ?Musculoskeletal:  Positive for arthralgias and myalgias.  ? ?Physical Exam ?Vital Signs  ?I have reviewed the triage vital signs ?BP (!) 155/75 (BP Location: Right Arm)   Pulse 76   Temp 98.2 ?F (36.8 ?C) (Oral)   Resp 14   Ht 5\' 2"  (1.575 m)   Wt 55.7 kg   SpO2 94%   BMI 22.46 kg/m?  ? ?Physical Exam ?Vitals and nursing note reviewed.  ?Constitutional:   ?    General: She is not in acute distress. ?   Appearance: She is well-developed.  ?HENT:  ?   Head: Normocephalic and atraumatic.  ?Eyes:  ?   Conjunctiva/sclera: Conjunctivae normal.  ?Cardiovascular:  ?   Rate and Rhythm: Normal rate and regular rhythm.  ?   Heart sounds: No murmur heard. ?Pulmonary:  ?   Effort: Pulmonary effort is normal. No respiratory distress.  ?   Breath sounds: Normal breath sounds.  ?Abdominal:  ?   Palpations: Abdomen is soft.  ?   Tenderness: There is generalized abdominal tenderness.  ?Musculoskeletal:     ?   General: No swelling.  ?   Cervical back: Neck supple.  ?   Comments: Dopplerable bilateral pulses in the lower extremities  ?Skin: ?   General: Skin is warm and dry.  ?   Capillary Refill: Capillary refill takes less than 2 seconds.  ?Neurological:  ?   Mental Status: She is alert.  ?Psychiatric:     ?   Mood and Affect: Mood normal.  ? ? ?ED Results and Treatments ?Labs ?(all labs ordered are listed, but only abnormal results are displayed) ?Labs Reviewed  ?COMPREHENSIVE METABOLIC PANEL - Abnormal; Notable for the following components:  ?    Result Value  ? Sodium 130 (*)   ? Potassium 3.2 (*)   ? Chloride 92 (*)   ? Glucose, Bld 124 (*)   ? Albumin 3.2 (*)   ? All other components within normal limits  ?CBC - Abnormal; Notable for the following components:  ? RDW 15.6 (*)   ? Platelets 467 (*)   ? All other components within normal limits  ?URINALYSIS, ROUTINE W REFLEX MICROSCOPIC - Abnormal; Notable for the following components:  ? Hgb urine dipstick MODERATE (*)   ? Protein, ur 100 (*)   ? Bacteria, UA RARE (*)   ? All other components within normal limits  ?LIPID PANEL - Abnormal; Notable for the following components:  ? Cholesterol 249 (*)   ? LDL Cholesterol 182 (*)   ? All other components within normal limits  ?BASIC METABOLIC PANEL - Abnormal; Notable for the following components:  ? Sodium  132 (*)   ? Chloride 97 (*)   ? All other components within normal limits  ?LIPASE,  BLOOD  ?HIV ANTIBODY (ROUTINE TESTING W REFLEX)  ?TROPONIN I (HIGH SENSITIVITY)  ?TROPONIN I (HIGH SENSITIVITY)  ?                                                                                                                       ? ?Radiology ?DG Chest 2 View ? ?Result Date: 09/10/2021 ?CLINICAL DATA:  Shortness of breath EXAM: CHEST - 2 VIEW COMPARISON:  Chest x-ray dated June 22, 2021 FINDINGS: The heart size and mediastinal contours are within normal limits. Both lungs are clear. Blunting of the bilateral costophrenic angles. The visualized skeletal structures are unremarkable. IMPRESSION: 1. No consolidation. 2. Blunting of the bilateral costophrenic angles which may be due to pleural thickening or trace bilateral pleural effusions. Electronically Signed   By: Allegra Lai M.D.   On: 09/10/2021 09:09  ? ?CT Angio Abd/Pel W and/or Wo Contrast ? ?Result Date: 09/10/2021 ?CLINICAL DATA:  Recent fem-pop bypass graft.  Evaluate for leak. EXAM: CTA ABDOMEN AND PELVIS WITHOUT AND WITH CONTRAST TECHNIQUE: Multidetector CT imaging of the abdomen and pelvis was performed using the standard protocol during bolus administration of intravenous contrast. Multiplanar reconstructed images and MIPs were obtained and reviewed to evaluate the vascular anatomy. RADIATION DOSE REDUCTION: This exam was performed according to the departmental dose-optimization program which includes automated exposure control, adjustment of the mA and/or kV according to patient size and/or use of iterative reconstruction technique. CONTRAST:  OMNIPAQUE IOHEXOL 350 MG/ML SOLN COMPARISON:  CTA abdominal aorta with ileal femoral runoff 04/14/2021. FINDINGS: VASCULAR Aorta: Moderate calcified plaque throughout the abdominal aorta which is normal in caliber. Interval aortobifemoral bypass graft beginning below the renal arteries which is patent from takeoff to touchdown site at the femoral arteries. Celiac: Patent. SMA: Moderate calcified  plaque at the origin with mild to moderate focal stenosis of the origin. No evidence of occlusion. Significant calcified plaque 2.6 cm distal from the origin with mild associated narrowing but no evidence of occlusion

## 2021-09-11 NOTE — Hospital Course (Addendum)
Kathryn Fowler is Kathryn Fowler 66 y.o. female with medical history significant of severe PVD status post bilateral aorto-femoral artery bypass February 2023, HTN, hyponatremia, HLD, came with worsening of B/L groin and thigh pain. ?  ?Symptoms started more than 1 month ago, [place] getting worse, pain has been dull like, worsening with ambulation.  About 1 week ago, the pain has progressed to lower abdomen, associated with nauseous but no vomiting no diarrhea.  No fever or chills.  Patient reported that her both feet always feel cold, but denies any discoloration of any other toes.  He denied [place] Dingley history of Lariya Kinzie-fib. ?  ?ED Course: Blood pressure elevated.  CT angiogram showed complete occlusion of the right femoral artery 1.5 cm distal to the bifurcation, recommending further CTA to evaluate.  Vascular surgery consulted. ? ?She's now s/p angiogram which showed chronic R SFA occlusion.  They doubt vascular etiology to pain.  Pain seems to be chronic and neuropathic in description, started on lyrica and cymbalta.  Consider neurology follow up outpatient. ? ?See below for additional details ?

## 2021-09-11 NOTE — Progress Notes (Addendum)
Patinet had 7 beats run of Vtac, pt was resting in bed with eyes closed. Stated she was dreaming, asymptomatic. On call MD DR. Shalhoub notified. Care ongoing.  ?

## 2021-09-11 NOTE — Plan of Care (Signed)

## 2021-09-11 NOTE — Assessment & Plan Note (Addendum)
C/o lower abdominal and LE pain ?CT with interval aortobifermoral bypass graft which is patent from takeoff to touchdown site at the femoral arteries.  Complete occlusion of the R femoral artery 1.5 cm distal to bifurcation, consider further eval with CTA pelvis/runoff.  Mild to moderate focal stenosis of the origin of the superior mesenteric artery.  Mild narrowing of bilateral renal arteries.  Small bilateral effusions, R>L. ?Echo with EF 60-65%, no RWMA, moderate asymmetric LVH of basal septal segment, grade 1 diastolic dysfunction ?Appreciate vascular surgery, s/p ultrasound guided cannulation L limb of aortobifemoral bypass graft, aortogram with bilateral LE runoff -> no intervention undertaken, suspect pain multifactorial ?Chronic R SFA occlusion ?Pain described sounds neuropathic -> ?Will start lyrica (she doesn't want gabapentin) and cymbalta (for chronic pain).  Consider neurology follow up outpatient. ?PT eval ? ?

## 2021-09-11 NOTE — Progress Notes (Addendum)
?  Progress Note ? ? ? ?09/11/2021 ?9:19 AM ?* No surgery found * ? ?Subjective:  R leg pain unchanged ? ? ?Vitals:  ? 09/11/21 0710 09/11/21 0755  ?BP: (!) 155/75 (!) 160/75  ?Pulse: 76 76  ?Resp: 14   ?Temp: 98.2 ?F (36.8 ?C) 98.5 ?F (36.9 ?C)  ?SpO2: 94% 91%  ? ?Physical Exam: ?Lungs:  non labored ?Extremities:  palpable L DP; brisk R PT and ATA by doppler ?Abdomen:  soft ?Neurologic: A&O ? ?CBC ?   ?Component Value Date/Time  ? WBC 8.5 09/10/2021 0824  ? RBC 4.63 09/10/2021 0824  ? HGB 13.7 09/10/2021 0824  ? HCT 40.5 09/10/2021 0824  ? PLT 467 (H) 09/10/2021 0824  ? MCV 87.5 09/10/2021 0824  ? MCH 29.6 09/10/2021 0824  ? MCHC 33.8 09/10/2021 0824  ? RDW 15.6 (H) 09/10/2021 0824  ? ? ?BMET ?   ?Component Value Date/Time  ? NA 132 (L) 09/11/2021 0145  ? K 4.3 09/11/2021 0145  ? CL 97 (L) 09/11/2021 0145  ? CO2 25 09/11/2021 0145  ? GLUCOSE 97 09/11/2021 0145  ? BUN 12 09/11/2021 0145  ? CREATININE 0.93 09/11/2021 0145  ? CALCIUM 9.1 09/11/2021 0145  ? GFRNONAA >60 09/11/2021 0145  ? ? ?INR ?   ?Component Value Date/Time  ? INR 1.1 06/21/2021 1208  ? ? ? ?Intake/Output Summary (Last 24 hours) at 09/11/2021 0919 ?Last data filed at 09/10/2021 2100 ?Gross per 24 hour  ?Intake 120 ml  ?Output --  ?Net 120 ml  ? ? ? ?Assessment/Plan:  66 y.o. female with RLE pain; chronic R SFA occlusion ? ?R foot warm and well perfused with brisk ATA and PTA signal by doppler; motor and sensation intact; left DP is palpable.  No signs of limb ischemia or any change since last office visit in March.  Etiology of pain is unclear.  We will continue to follow.  She will be npo past midnight in case Dr. Randie Heinz considers angiography. ? ? ?Kathryn Rutter, PA-C ?[facility] ?[phone] ?09/11/2021 ?9:19 AM ? ? ?I agree with the above assessment and plan as documented by the PA.  I will speak with Dr. Randie Heinz this evening and determine whether or not he wants to proceed with angiography to pursue additional options for  revascularization.  However I do feel that her issues are multifactorial.  I would keep her n.p.o. after midnight in case Dr. Randie Heinz wants to proceed with angiography tomorrow ? ?Kathryn Fowler ?

## 2021-09-11 NOTE — Progress Notes (Signed)
?PROGRESS NOTE ? ? ? ?Kathryn Fowler  [id number] DOB: [birth year] DOA: 09/10/2021 ?PCP: Cyndi Bender, PA-C  ?Chief Complaint  ?Patient presents with  ? Abdominal Pain  ? ? ?Brief Narrative:  ?Kathryn Fowler is Kathryn Fowler 66 y.o. female with medical history significant of severe PVD status post bilateral aorto-femoral artery bypass February 2023, HTN, hyponatremia, HLD, came with worsening of B/L groin and thigh pain. ?  ?Symptoms started more than 1 month ago, Kathryn Fowler getting worse, pain has been dull like, worsening with ambulation.  About 1 week ago, the pain has progressed to lower abdomen, associated with nauseous but no vomiting no diarrhea.  No fever or chills.  Patient reported that her both feet always feel cold, but denies any discoloration of any other toes.  He denied Kathryn Fowler history of Kathryn Fowler-fib. ?  ?ED Course: Blood pressure elevated.  CT angiogram showed complete occlusion of the right femoral artery 1.5 cm distal to the bifurcation, recommending further CTA to evaluate.  Vascular surgery consulted.  ? ? ?Assessment & Plan: ?  ?Principal Problem: ?  Limb ischemia ?Active Problems: ?  Essential hypertension ?  Hyponatremia ?  PVD (peripheral vascular disease) ([place]) ?  Pleural effusion ?  Lower limb ischemia ? ? ?Assessment and Plan: ?* Limb ischemia ?C/o lower abdominal and LE pain ?CT with interval aortobifermoral bypass graft which is patent from takeoff to touchdown site at the femoral arteries.  Complete occlusion of the R femoral artery 1.5 cm distal to bifurcation, consider further eval with CTA pelvis/runoff.  Mild to moderate focal stenosis of the origin of the superior mesenteric artery.  Mild narrowing of bilateral renal arteries.  Small bilateral effusions, R>L. ?Echo pending ?Appreciate vascular surgery, considering angiography ? ?Essential hypertension ?Significantly elevated ?Now improved ?Follow now on amlodipine, transitioned to coreg (from metop), arb currently on hold ? ?Hyponatremia ?Mild,  follow ? ?PVD (peripheral vascular disease) ([place]) ?S/p aorto bifemoral bypass 06/2021 ? ?Pleural effusion ?Small bilateral effusions R>L ?Follow imaging ? ? ?DVT prophylaxis: lovenox ?Code Status: full ?Family Communication: none ?Disposition:  ? ?Status is: Inpatient ?Remains inpatient appropriate because: need for continued vascular eval ?  ?Consultants:  ?vascular ? ?Procedures:  ?none ? ?Antimicrobials:  ?Anti-infectives (From admission, onward)  ? ? None  ? ?  ? ? ?Subjective: ?Continued pain in lower abdomen and R>L LE ? ?Objective: ?Vitals:  ? 09/10/21 2041 09/11/21 0000 09/11/21 0710 09/11/21 0755  ?BP: 139/60 139/68 (!) 155/75 (!) 160/75  ?Pulse: 77 78 76 76  ?Resp: 19 20 14    ?Temp: 97.8 ?F (36.6 ?C) 98.5 ?F (36.9 ?C) 98.2 ?F (36.8 ?C) 98.5 ?F (36.9 ?C)  ?TempSrc: Oral Oral Oral Oral  ?SpO2: 95% 92% 94% 91%  ?Weight:      ?Height:      ? ? ?Intake/Output Summary (Last 24 hours) at 09/11/2021 1145 ?Last data filed at 09/10/2021 2100 ?Gross per 24 hour  ?Intake 120 ml  ?Output --  ?Net 120 ml  ? ?Filed Weights  ? 09/10/21 1545  ?Weight: 55.7 kg  ? ? ?Examination: ? ?General exam: Appears calm and comfortable  ?Respiratory system: unlabored ?Cardiovascular system: RRR ?Gastrointestinal system: lower abdominal tenderness ?Central nervous system: Alert and oriented. No focal neurological deficits. ?Extremities: no LEE, palpable DP pulses ? ? ? ?Data Reviewed: I have personally reviewed following labs and imaging studies ? ?CBC: ?Recent Labs  ?Lab 09/10/21 ?[id number]  ?WBC 8.5  ?HGB 13.7  ?HCT 40.5  ?MCV 87.5  ?PLT 467*  ? ? ?  Basic Metabolic Panel: ?Recent Labs  ?Lab 09/10/21 ?0824 09/11/21 ?0145  ?NA 130* 132*  ?K 3.2* 4.3  ?CL 92* 97*  ?CO2 28 25  ?GLUCOSE 124* 97  ?BUN 9 12  ?CREATININE 0.74 0.93  ?CALCIUM 9.1 9.1  ? ? ?GFR: ?Estimated Creatinine Clearance: 47.7 mL/min (by C-G formula based on SCr of 0.93 mg/dL). ? ?Liver Function Tests: ?Recent Labs  ?Lab 09/10/21 ?[id number]  ?AST 18  ?ALT 9  ?ALKPHOS 71  ?BILITOT 1.0   ?PROT 6.5  ?ALBUMIN 3.2*  ? ? ?CBG: ?No results for input(s): GLUCAP in the last 168 hours. ? ? ?No results found for this or any previous visit (from the past 240 hour(s)).  ? ? ? ? ? ?Radiology Studies: ?DG Chest 2 View ? ?Result Date: 09/10/2021 ?CLINICAL DATA:  Shortness of breath EXAM: CHEST - 2 VIEW COMPARISON:  Chest x-ray dated June 22, 2021 FINDINGS: The heart size and mediastinal contours are within normal limits. Both lungs are clear. Blunting of the bilateral costophrenic angles. The visualized skeletal structures are unremarkable. IMPRESSION: 1. No consolidation. 2. Blunting of the bilateral costophrenic angles which may be due to pleural thickening or trace bilateral pleural effusions. Electronically Signed   By: Yetta Glassman M.D.   On: 09/10/2021 09:09  ? ?CT Angio Abd/Pel W and/or Wo Contrast ? ?Result Date: 09/10/2021 ?CLINICAL DATA:  Recent fem-pop bypass graft.  Evaluate for leak. EXAM: CTA ABDOMEN AND PELVIS WITHOUT AND WITH CONTRAST TECHNIQUE: Multidetector CT imaging of the abdomen and pelvis was performed using the standard protocol during bolus administration of intravenous contrast. Multiplanar reconstructed images and MIPs were obtained and reviewed to evaluate the vascular anatomy. RADIATION DOSE REDUCTION: This exam was performed according to the departmental dose-optimization program which includes automated exposure control, adjustment of the mA and/or kV according to patient size and/or use of iterative reconstruction technique. CONTRAST:  148mL OMNIPAQUE IOHEXOL 350 MG/ML SOLN COMPARISON:  CTA abdominal aorta with ileal femoral runoff 04/14/2021. FINDINGS: VASCULAR Aorta: Moderate calcified plaque throughout the abdominal aorta which is normal in caliber. Interval aortobifemoral bypass graft beginning below the renal arteries which is patent from takeoff to touchdown site at the femoral arteries. Celiac: Patent. SMA: Moderate calcified plaque at the origin with mild to moderate  focal stenosis of the origin. No evidence of occlusion. Significant calcified plaque 2.6 cm distal from the origin with mild associated narrowing but no evidence of occlusion. Renals: Calcified plaque at the origin of the renal arteries bilaterally with mild narrowing at the origin bilaterally. Renal arteries otherwise patent with normal opacification. IMA: Patent as the IMA fills from the native aorta. Inflow: Normal via patent aortobifemoral graft. Calcified plaque over the native internal iliac arteries which are patent. Proximal Outflow: Patent touchdown site of the aortobifemoral bypass graft. Calcified plaque over the proximal femoral arteries distal to the bifurcation with calcified plaque also over the profundus femoral arteries bilaterally. There is complete occlusion of the right femoral artery 1.5 cm distal to the bifurcation. Veins: Unremarkable. Review of the MIP images confirms the above findings. NON-VASCULAR Lower chest: Lung bases demonstrate small bilateral effusions right greater than left with associated compressive atelectasis. Mild cardiomegaly. Calcified plaque over the descending thoracic aorta. Hepatobiliary: Liver and biliary tree are within normal. Gallbladder is unremarkable. Pancreas: Normal. Spleen: Normal. Adrenals/Urinary Tract: Adrenal glands are normal. Kidneys are normal in size without hydronephrosis or nephrolithiasis. Ureters and bladder are normal. Stomach/Bowel: Stomach and small bowel are normal. Appendix is normal. Mild fecal retention throughout  the colon. Lymphatic: No adenopathy. Reproductive: Normal. Other: Minimal free fluid over the left pelvis. Musculoskeletal: No focal abnormality. Old left superior pubic ramus fracture towards the symphysis. IMPRESSION: 1. Interval aortobifemoral bypass graft which is patent from takeoff to touchdown site at the femoral arteries. 2. Complete occlusion of the right femoral artery 1.5 cm distal to the bifurcation. Consider further  evaluation with CTA pelvis/runoff. 3. Mild to moderate focal stenosis of the origin of the superior mesenteric artery. Mild narrowing of the bilateral renal arteries. 4. Small bilateral effusions right greater than

## 2021-09-11 NOTE — Assessment & Plan Note (Addendum)
Small bilateral effusions R>L ?CXR today with mild bibasilar atelectasis, improvement in small R effusion -> possible PTX, follow repeat x ray (negative for ptx) ?

## 2021-09-11 NOTE — Assessment & Plan Note (Addendum)
S/p aorto bifemoral bypass 06/2021 ?Aspirin, lipitor ?

## 2021-09-11 NOTE — Assessment & Plan Note (Addendum)
Significantly elevated ?Now improved ?Follow now on amlodipine, transitioned to coreg (from metop).  Resume lasix and losartan at discharge ?

## 2021-09-12 ENCOUNTER — Encounter (HOSPITAL_COMMUNITY)
Admission: EM | Disposition: A | Discharge status: Home / Self Care | Payer: Self-pay | Source: Home / Self Care | Attending: Family Medicine

## 2021-09-12 ENCOUNTER — Ambulatory Visit (HOSPITAL_COMMUNITY): Payer: BC Managed Care – PPO

## 2021-09-12 ENCOUNTER — Encounter (HOSPITAL_COMMUNITY): Payer: Self-pay | Admitting: Vascular Surgery

## 2021-09-12 DIAGNOSIS — M79605 Pain in left leg: Secondary | ICD-10-CM | POA: Diagnosis not present

## 2021-09-12 DIAGNOSIS — I70223 Atherosclerosis of native arteries of extremities with rest pain, bilateral legs: Secondary | ICD-10-CM

## 2021-09-12 DIAGNOSIS — M79604 Pain in right leg: Secondary | ICD-10-CM | POA: Diagnosis not present

## 2021-09-12 DIAGNOSIS — M549 Dorsalgia, unspecified: Secondary | ICD-10-CM

## 2021-09-12 HISTORY — PX: LOWER EXTREMITY ANGIOGRAPHY: CATH118251

## 2021-09-12 LAB — COMPREHENSIVE METABOLIC PANEL
ALT: 9 U/L (ref 0–44)
AST: 17 U/L (ref 15–41)
Albumin: 3 g/dL — ABNORMAL LOW (ref 3.5–5.0)
Alkaline Phosphatase: 66 U/L (ref 38–126)
Anion gap: 11 (ref 5–15)
BUN: 13 mg/dL (ref 8–23)
CO2: 26 mmol/L (ref 22–32)
Calcium: 9.1 mg/dL (ref 8.9–10.3)
Chloride: 94 mmol/L — ABNORMAL LOW (ref 98–111)
Creatinine, Ser: 0.78 mg/dL (ref 0.44–1.00)
GFR, Estimated: >60 mL/min (ref >60)
Glucose, Bld: 94 mg/dL (ref 70–99)
Potassium: 4.2 mmol/L (ref 3.5–5.1)
Sodium: 131 mmol/L — ABNORMAL LOW (ref 135–145)
Total Bilirubin: 0.8 mg/dL (ref 0.3–1.2)
Total Protein: 6.1 g/dL — ABNORMAL LOW (ref 6.5–8.1)

## 2021-09-12 LAB — CBC WITH DIFFERENTIAL/PLATELET
Abs Immature Granulocytes: 0.03 10*3/uL (ref 0.00–0.07)
Basophils Absolute: 0.1 10*3/uL (ref 0.0–0.1)
Basophils Relative: 1 %
Eosinophils Absolute: 0.1 10*3/uL (ref 0.0–0.5)
Eosinophils Relative: 1 %
HCT: 37.1 % (ref 36.0–46.0)
Hemoglobin: 12 g/dL (ref 12.0–15.0)
Immature Granulocytes: 0 %
Lymphocytes Relative: 13 %
Lymphs Abs: 1.2 10*3/uL (ref 0.7–4.0)
MCH: 29.1 pg (ref 26.0–34.0)
MCHC: 32.3 g/dL (ref 30.0–36.0)
MCV: 90 fL (ref 80.0–100.0)
Monocytes Absolute: 0.8 10*3/uL (ref 0.1–1.0)
Monocytes Relative: 9 %
Neutro Abs: 7.1 10*3/uL (ref 1.7–7.7)
Neutrophils Relative %: 76 %
Platelets: 469 10*3/uL — ABNORMAL HIGH (ref 150–400)
RBC: 4.12 MIL/uL (ref 3.87–5.11)
RDW: 15.3 % (ref 11.5–15.5)
WBC: 9.4 10*3/uL (ref 4.0–10.5)
nRBC: 0 % (ref 0.0–0.2)

## 2021-09-12 LAB — BRAIN NATRIURETIC PEPTIDE: B Natriuretic Peptide: 157.4 pg/mL — ABNORMAL HIGH (ref 0.0–100.0)

## 2021-09-12 LAB — MAGNESIUM: Magnesium: 1.7 mg/dL (ref 1.7–2.4)

## 2021-09-12 LAB — PHOSPHORUS: Phosphorus: 3.9 mg/dL (ref 2.5–4.6)

## 2021-09-12 SURGERY — LOWER EXTREMITY ANGIOGRAPHY
Anesthesia: LOCAL

## 2021-09-12 MED ORDER — MIDAZOLAM HCL 2 MG/2ML IJ SOLN
INTRAMUSCULAR | Status: DC | PRN
  Administered 2021-09-12: 1 mg via INTRAVENOUS

## 2021-09-12 MED ORDER — ACETAMINOPHEN 325 MG PO TABS: 325.0000 mg | ORAL_TABLET | Freq: Three times a day (TID) | ORAL | Status: DC | PRN

## 2021-09-12 MED ORDER — FENTANYL CITRATE (PF) 100 MCG/2ML IJ SOLN
INTRAMUSCULAR | Status: DC | PRN
  Administered 2021-09-12: 50 ug via INTRAVENOUS

## 2021-09-12 MED ORDER — ONDANSETRON HCL 4 MG/2ML IJ SOLN: 4.0000 mg | Freq: Four times a day (QID) | INTRAMUSCULAR | Status: DC | PRN

## 2021-09-12 MED ORDER — DICLOFENAC SODIUM 1 % EX GEL
2.0000 g | Freq: Four times a day (QID) | CUTANEOUS | Status: DC
  Administered 2021-09-12 – 2021-09-13 (×4): 2 g via TOPICAL
  Filled 2021-09-12: qty 100

## 2021-09-12 MED ORDER — SODIUM CHLORIDE 0.9% FLUSH: 3.0000 mL | INTRAVENOUS | Status: DC | PRN

## 2021-09-12 MED ORDER — DULOXETINE HCL 30 MG PO CPEP
30.0000 mg | ORAL_CAPSULE | Freq: Every day | ORAL | Status: DC
  Administered 2021-09-12 – 2021-09-13 (×2): 30 mg via ORAL
  Filled 2021-09-12 (×2): qty 1

## 2021-09-12 MED ORDER — SODIUM CHLORIDE 0.9 % IV SOLN: 250.0000 mL | INTRAVENOUS | Status: DC | PRN

## 2021-09-12 MED ORDER — HEPARIN (PORCINE) IN NACL 1000-0.9 UT/500ML-% IV SOLN
INTRAVENOUS | Status: AC
  Filled 2021-09-12: qty 500

## 2021-09-12 MED ORDER — LABETALOL HCL 5 MG/ML IV SOLN: 10.0000 mg | INTRAVENOUS | Status: DC | PRN

## 2021-09-12 MED ORDER — HEPARIN (PORCINE) IN NACL 1000-0.9 UT/500ML-% IV SOLN
INTRAVENOUS | Status: DC | PRN
Start: 2021-09-12 — End: 2021-09-12
  Administered 2021-09-12 (×2): 500 mL

## 2021-09-12 MED ORDER — SODIUM CHLORIDE 0.9 % IV SOLN: INTRAVENOUS | Status: DC

## 2021-09-12 MED ORDER — LIDOCAINE HCL (PF) 1 % IJ SOLN
INTRAMUSCULAR | Status: DC | PRN
  Administered 2021-09-12: 12 mL

## 2021-09-12 MED ORDER — SODIUM CHLORIDE 0.9 % IV SOLN: INTRAVENOUS | Status: AC

## 2021-09-12 MED ORDER — ACETAMINOPHEN 500 MG PO TABS
500.0000 mg | ORAL_TABLET | Freq: Four times a day (QID) | ORAL | Status: DC
  Administered 2021-09-12 – 2021-09-13 (×4): 500 mg via ORAL
  Filled 2021-09-12 (×5): qty 1

## 2021-09-12 MED ORDER — HYDRALAZINE HCL 20 MG/ML IJ SOLN: 5.0000 mg | INTRAMUSCULAR | Status: DC | PRN

## 2021-09-12 MED ORDER — ACETAMINOPHEN 325 MG PO TABS: 650.0000 mg | ORAL_TABLET | ORAL | Status: DC | PRN

## 2021-09-12 MED ORDER — SODIUM CHLORIDE 0.9% FLUSH
3.0000 mL | Freq: Two times a day (BID) | INTRAVENOUS | Status: DC
  Administered 2021-09-12 – 2021-09-13 (×3): 3 mL via INTRAVENOUS

## 2021-09-12 MED ORDER — MIDAZOLAM HCL 2 MG/2ML IJ SOLN
INTRAMUSCULAR | Status: AC
  Filled 2021-09-12: qty 2

## 2021-09-12 MED ORDER — GABAPENTIN 100 MG PO CAPS
100.0000 mg | ORAL_CAPSULE | Freq: Three times a day (TID) | ORAL | Status: DC
  Administered 2021-09-12 – 2021-09-13 (×4): 100 mg via ORAL
  Filled 2021-09-12 (×4): qty 1

## 2021-09-12 MED ORDER — ATORVASTATIN CALCIUM 10 MG PO TABS
20.0000 mg | ORAL_TABLET | Freq: Every day | ORAL | Status: DC
  Administered 2021-09-12 – 2021-09-13 (×2): 20 mg via ORAL
  Filled 2021-09-12 (×2): qty 2

## 2021-09-12 MED ORDER — LIDOCAINE 5 % EX PTCH
1.0000 | MEDICATED_PATCH | CUTANEOUS | Status: DC
  Administered 2021-09-12 – 2021-09-13 (×2): 1 via TRANSDERMAL
  Filled 2021-09-12 (×2): qty 1

## 2021-09-12 MED ORDER — LIDOCAINE HCL (PF) 1 % IJ SOLN
INTRAMUSCULAR | Status: AC
  Filled 2021-09-12: qty 30

## 2021-09-12 MED ORDER — FENTANYL CITRATE (PF) 100 MCG/2ML IJ SOLN
INTRAMUSCULAR | Status: AC
Start: 2021-09-12 — End: ?
  Filled 2021-09-12: qty 2

## 2021-09-12 SURGICAL SUPPLY — 9 items
CATH OMNI FLUSH 5F 65CM (CATHETERS) ×1 IMPLANT
CLOSURE MYNX CONTROL 5F (Vascular Products) ×1 IMPLANT
KIT MICROPUNCTURE NIT STIFF (SHEATH) ×1 IMPLANT
KIT PV (KITS) ×2 IMPLANT
SHEATH PINNACLE 5F 10CM (SHEATH) ×1 IMPLANT
SYR MEDRAD MARK V 150ML (SYRINGE) ×1 IMPLANT
TRANSDUCER W/STOPCOCK (MISCELLANEOUS) ×2 IMPLANT
TRAY PV CATH (CUSTOM PROCEDURE TRAY) ×2 IMPLANT
WIRE BENTSON .035X145CM (WIRE) ×1 IMPLANT

## 2021-09-12 NOTE — Plan of Care (Signed)

## 2021-09-12 NOTE — Progress Notes (Signed)
?PROGRESS NOTE ? ? ? ?Kathryn Fowler  [id number] DOB: August 19, 1955 DOA: 09/10/2021 ?PCP: Cyndi Bender, PA-C  ?Chief Complaint  ?Patient presents with  ? Abdominal Pain  ? ? ?Brief Narrative:  ?Kathryn Fowler is Kathryn Fowler 66 y.o. female with medical history significant of severe PVD status post bilateral aorto-femoral artery bypass February 2023, HTN, hyponatremia, HLD, came with worsening of B/L groin and thigh pain. ?  ?Symptoms started more than 1 month ago, [place] getting worse, pain has been dull like, worsening with ambulation.  About 1 week ago, the pain has progressed to lower abdomen, associated with nauseous but no vomiting no diarrhea.  No fever or chills.  Patient reported that her both feet always feel cold, but denies any discoloration of any other toes.  He denied Whitaker Holderman history of Jessamyn Watterson-fib. ?  ?ED Course: Blood pressure elevated.  CT angiogram showed complete occlusion of the right femoral artery 1.5 cm distal to the bifurcation, recommending further CTA to evaluate.  Vascular surgery consulted.  ? ? ?Assessment & Plan: ?  ?Principal Problem: ?  Lower extremity pain ?Active Problems: ?  Back pain ?  Essential hypertension ?  Hyponatremia ?  PVD (peripheral vascular disease) ([place]) ?  Pleural effusion ?  Lower limb ischemia ? ? ?Assessment and Plan: ?* Lower extremity pain ?C/o lower abdominal and LE pain ?CT with interval aortobifermoral bypass graft which is patent from takeoff to touchdown site at the femoral arteries.  Complete occlusion of the R femoral artery 1.5 cm distal to bifurcation, consider further eval with CTA pelvis/runoff.  Mild to moderate focal stenosis of the origin of the superior mesenteric artery.  Mild narrowing of bilateral renal arteries.  Small bilateral effusions, R>L. ?Echo with EF 60-65%, no RWMA, moderate asymmetric LVH of basal septal segment, grade 1 diastolic dysfunction ?Appreciate vascular surgery, s/p ultrasound guided cannulation L limb of aortobifemoral bypass graft,  aortogram with bilateral LE runoff -> no intervention undertaken, suspect pain multifactorial ?Will start gabapentin (which she was previously on) and cymbalta (for chronic pain) ?PT eval ? ? ?Back pain ?Chronic 08/2021 MRI of lumbar spine with bilateral sacral alar insufficiency fractures, L>R as well as multilevel degenerative changes of the lumbar spine.  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 ?Hyponatremia ?Mild, follow ? ?Essential hypertension ?Significantly elevated ?Now improved ?Follow now on amlodipine, transitioned to coreg (from metop), arb currently on hold ? ?PVD (peripheral vascular disease) ([place]) ?S/p aorto bifemoral bypass 06/2021 ? ?Pleural effusion ?Small bilateral effusions R>L ?Follow imaging ? ? ?DVT prophylaxis: lovenox ?Code Status: full ?Family Communication: none ?Disposition:  ? ?Status is: Inpatient ?Remains inpatient appropriate because: need for continued vascular eval ?  ?Consultants:  ?vascular ? ?Procedures:  ?none ? ?Antimicrobials:  ?Anti-infectives (From admission, onward)  ? ? None  ? ?  ? ? ?Subjective: ?Continued lower extremity and back pain ? ?Objective: ?Vitals:  ? 09/12/21 0952 09/12/21 0957 09/12/21 1002 09/12/21 1150  ?BP: 140/64 (!) 145/66 (!) 148/67 (!) 159/69  ?Pulse: 77 74 78 77  ?Resp: 12 11 15 16   ?Temp:    98.4 ?F (36.9 ?C)  ?TempSrc:    Oral  ?SpO2: (!) 88% (!) 88% 90% 94%  ?Weight:      ?Height:      ? ? ?Intake/Output Summary (Last 24 hours) at 09/12/2021 1511 ?Last data filed at 09/11/2021 2009 ?Gross per 24 hour  ?Intake 120 ml  ?Output --  ?Net 120 ml  ? ?  Filed Weights  ? 09/10/21 1545  ?Weight: 55.7 kg  ? ? ?Examination: ? ?General: No acute distress. ?Cardiovascular: RRR ?Lungs: unlabored ?Neurological: Alert and oriented ?3. Moves all extremities ?4  Cranial nerves II through XII grossly intact. ?Extremities: no LE edema, palpable DP pulses bilaterally L>R ? ? ? ? ?Data Reviewed: I have personally  reviewed following labs and imaging studies ? ?CBC: ?Recent Labs  ?Lab 09/10/21 ?0824 09/12/21 ?0228  ?WBC 8.5 9.4  ?NEUTROABS  --  7.1  ?HGB 13.7 12.0  ?HCT 40.5 37.1  ?MCV 87.5 90.0  ?PLT 467* 469*  ? ? ?Basic Metabolic Panel: ?Recent Labs  ?Lab 09/10/21 ?[id number] 09/11/21 ?0145 09/12/21 ?0228  ?NA 130* 132* 131*  ?K 3.2* 4.3 4.2  ?CL 92* 97* 94*  ?CO2 28 25 26   ?GLUCOSE 124* 97 94  ?BUN 9 12 13   ?CREATININE 0.74 0.93 0.78  ?CALCIUM 9.1 9.1 9.1  ?MG  --   --  1.7  ?PHOS  --   --  3.9  ? ? ?GFR: ?Estimated Creatinine Clearance: 55.4 mL/min (by C-G formula based on SCr of 0.78 mg/dL). ? ?Liver Function Tests: ?Recent Labs  ?Lab 09/10/21 ?0824 09/12/21 ?0228  ?AST 18 17  ?ALT 9 9  ?ALKPHOS 71 66  ?BILITOT 1.0 0.8  ?PROT 6.5 6.1*  ?ALBUMIN 3.2* 3.0*  ? ? ?CBG: ?No results for input(s): GLUCAP in the last 168 hours. ? ? ?No results found for this or any previous visit (from the past 240 hour(s)).  ? ? ? ? ? ?Radiology Studies: ?PERIPHERAL VASCULAR CATHETERIZATION ? ?Result Date: 09/12/2021 ?Images from the original result were not included. Patient name: Kathryn Fowler MRN: [id number] DOB: 1956/03/25 Sex: female 09/12/2021 Pre-operative Diagnosis: Bilateral lower extremity pain, peripheral arterial disease Post-operative diagnosis:  Same Surgeon:  Eda Paschal. Donzetta Matters, MD Procedure Performed: 1.  Ultrasound-guided cannulation left limb of aortobifemoral bypass graft 2.  Aortogram with bilateral lower extremity runoff 3.  Moderate sedation with fentanyl and Versed for 36 minutes 4.  Mynx device closure left common femoral artery Indications: 66 year old female with history of aortobifemoral bypass for bilateral lower extremity pain.  She does have palpable left dorsalis pedis pulse and strong signals on the right.  She does have bilateral lower extremity pain she is indicated for angiography with possible. Findings: Aortobifemoral bypass does have twisted limbs but  there is Jermone Geister strong palpable right common femoral pulse.  The graft  is patent to both common femoral arteries.  On the right side the SFA occludes shortly after the takeoff is occluded for Andrell Bergeson long segment reconstitutes above the knee popliteal artery and then has runoff via the anterior tibial artery and the dominant peroneal.  On the left side the SFA is patent all the way to the popliteal artery and has three-vessel runoff dominant via the anterior tibial artery. No intervention was undertaken.  The patient had right lower extremity symptoms only we could consider right femoropopliteal bypass but at this time I think her pain is multifactorial in nature.  Procedure:  The patient was identified in the holding area and taken to room 8.  The patient was then placed supine on the table and prepped and draped in the usual sterile fashion.  Tatelyn Vanhecke time out was called.  Ultrasound was used to evaluate the left groin I identified the left limb of the aortobifemoral bypass graft the area was anesthetized with 1% lidocaine and I cannulated the graft with micropuncture needle followed by wire sheath.  Images  saved the permanent record.  We placed Kaycee Haycraft Bentson wire followed by 5 [place] sheath and Omni catheter to the level of L1 and performed aortogram followed by pelvic angiography and an angled view to identify the limbs of the aortobifemoral bypass graft and then bilateral lower extremity runoff.  With the above findings I removed the catheter over the wire.  I performed retrograde angiography the stick did appear to be Cathleen Yagi little bit high in the graft.  For this reason I deployed Lukka Black minx device which deployed well.  She tolerated the procedure well without immediate complication. Contrast: 115cc Brandon C. Donzetta Matters, MD [facility] [place] Office: [phone] Pager: [phone] ? ?ECHOCARDIOGRAM COMPLETE ? ?Result Date: 09/11/2021 ?   ECHOCARDIOGRAM REPORT   Patient Name:   Kathryn Fowler Date of Exam: 09/11/2021 Medical Rec #:  [id number]        Height:       62.0 in Accession #:     [id number]       Weight:       122.8 lb Date of Birth:  [birth year]        BSA:          1.554 m? Patient Age:    9 years         BP:           139/68 mmHg Patient Gender: F                HR:           70 bpm. Ex

## 2021-09-12 NOTE — Progress Notes (Addendum)
?  Progress Note ? ? ? ?09/12/2021 ?7:45 AM ? ?Subjective:  left leg hurts more than right ? ? ?Vitals:  ? 09/12/21 0448 09/12/21 0524  ?BP:  (!) 143/72  ?Pulse:  69  ?Resp:  18  ?Temp: 98.3 ?F (36.8 ?C)   ?SpO2:    ? ?Physical Exam: ?Lungs:  non labored ?Extremities:  palpable L DP pulse; R PT and AT brisk by doppler ?Neurologic: A&O ? ?CBC ?   ?Component Value Date/Time  ? WBC 9.4 09/12/2021 0228  ? RBC 4.12 09/12/2021 0228  ? HGB 12.0 09/12/2021 0228  ? HCT 37.1 09/12/2021 0228  ? PLT 469 (H) 09/12/2021 0228  ? MCV 90.0 09/12/2021 0228  ? MCH 29.1 09/12/2021 0228  ? MCHC 32.3 09/12/2021 0228  ? RDW 15.3 09/12/2021 0228  ? LYMPHSABS 1.2 09/12/2021 0228  ? MONOABS 0.8 09/12/2021 0228  ? EOSABS 0.1 09/12/2021 0228  ? BASOSABS 0.1 09/12/2021 0228  ? ? ?BMET ?   ?Component Value Date/Time  ? NA 131 (L) 09/12/2021 0228  ? K 4.2 09/12/2021 0228  ? CL 94 (L) 09/12/2021 0228  ? CO2 26 09/12/2021 0228  ? GLUCOSE 94 09/12/2021 0228  ? BUN 13 09/12/2021 0228  ? CREATININE 0.78 09/12/2021 0228  ? CALCIUM 9.1 09/12/2021 0228  ? GFRNONAA >60 09/12/2021 0228  ? ? ?INR ?   ?Component Value Date/Time  ? INR 1.1 06/21/2021 1208  ? ? ? ?Intake/Output Summary (Last 24 hours) at 09/12/2021 0745 ?Last data filed at 09/11/2021 2009 ?Gross per 24 hour  ?Intake 120 ml  ?Output --  ?Net 120 ml  ? ? ? ?Assessment/Plan:  66 y.o. female with BLE pain, R SFA occlusion ? ?Patient believes left leg is hurting more than the right this morning ?She has a palpable L DP pulse; R foot also well perfused despite SFA occlusion ?Pain unlikely related to circulation however Dr. Randie Heinz will evaluate later today ? ? ?Emilie Rutter, PA-C ?[facility] ?[phone] ?09/12/2021 ?7:45 AM ? ? ?I have independently interviewed and examiend patient and agree with PA assessment and plan above.  She does have an easily palpable left dorsalis pedis but is complaining of pain really everywhere today.  Given that she has recent CT which demonstrated occluded  SFA we will plan for angiography today but I do not think her pain is ischemic in nature.  I discussed this with the patient she demonstrates good understanding. ? ?Dane Bloch C. Randie Heinz, MD ?[facility] ?Office: [phone] ?Pager: [phone] ? ?

## 2021-09-12 NOTE — Assessment & Plan Note (Addendum)
Chronic 08/2021 MRI of lumbar spine with bilateral sacral alar insufficiency fractures, L>R as well as multilevel degenerative changes of the lumbar spine.  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

## 2021-09-12 NOTE — Op Note (Signed)
? ? ?  Patient name: Kathryn Fowler MRN: [id number] DOB: [birth year] Sex: female ? ?09/12/2021 ?Pre-operative Diagnosis: Bilateral lower extremity pain, peripheral arterial disease ?Post-operative diagnosis:  Same ?Surgeon:  Luanna Salk. Randie Heinz, MD ?Procedure Performed: ?1.  Ultrasound-guided cannulation left limb of aortobifemoral bypass graft ?2.  Aortogram with bilateral lower extremity runoff ?3.  Moderate sedation with fentanyl and Versed for 36 minutes ?4.  Mynx device closure left common femoral artery ? ?Indications: 66 year old female with history of aortobifemoral bypass for bilateral lower extremity pain.  She does have palpable left dorsalis pedis pulse and strong signals on the right.  She does have bilateral lower extremity pain she is indicated for angiography with possible. ? ?Findings: Aortobifemoral bypass does have twisted limbs but  there is a strong palpable right common femoral pulse.  The graft is patent to both common femoral arteries.  On the right side the SFA occludes shortly after the takeoff is occluded for a long segment reconstitutes above the knee popliteal artery and then has runoff via the anterior tibial artery and the dominant peroneal.  On the left side the SFA is patent all the way to the popliteal artery and has three-vessel runoff dominant via the anterior tibial artery. ? ?No intervention was undertaken.  The patient had right lower extremity symptoms only we could consider right femoropopliteal bypass but at this time I think her pain is multifactorial in nature. ? ? ?  ?Procedure:  The patient was identified in the holding area and taken to room 8.  The patient was then placed supine on the table and prepped and draped in the usual sterile fashion.  A time out was called.  Ultrasound was used to evaluate the left groin I identified the left limb of the aortobifemoral bypass graft the area was anesthetized with 1% lidocaine and I cannulated the graft with micropuncture needle  followed by wire sheath.  Images saved the permanent record.  We placed a Bentson wire followed by 5 [place] sheath and Omni catheter to the level of L1 and performed aortogram followed by pelvic angiography and an angled view to identify the limbs of the aortobifemoral bypass graft and then bilateral lower extremity runoff.  With the above findings I removed the catheter over the wire.  I performed retrograde angiography the stick did appear to be a little bit high in the graft.  For this reason I deployed a minx device which deployed well.  She tolerated the procedure well without immediate complication. ? ? ?Contrast: 115cc ? ?Jalesa Thien C. Randie Heinz, MD ?[facility] ?Office: [phone] ?Pager: [phone] ? ? ?

## 2021-09-12 NOTE — Progress Notes (Signed)
Mobility Specialist: Progress Note ? ? 09/12/21 1721  ?Mobility  ?Activity Ambulated with assistance in hallway  ?Level of Assistance Standby assist, set-up cues, supervision of patient - no hands on  ?Assistive Device Front wheel walker  ?Distance Ambulated (ft) 170 ft  ?Activity Response Tolerated well  ?$Mobility charge 1 Mobility  ? ?Pt received in bed and agreeable to ambulation. C/o BLE and back pain she rated 8-9/10, otherwise no c/o. Pt back to bed after session with call bell and phone at her side.  ? ?Cristal Deer Maira Christon ?Mobility Specialist ?Mobility Specialist 5 [address]: [phone] ?Mobility Specialist 6 [address]: [phone] ? ?

## 2021-09-13 ENCOUNTER — Inpatient Hospital Stay (HOSPITAL_COMMUNITY): Payer: BC Managed Care – PPO

## 2021-09-13 DIAGNOSIS — E785 Hyperlipidemia, unspecified: Secondary | ICD-10-CM

## 2021-09-13 LAB — CBC WITH DIFFERENTIAL/PLATELET
Abs Immature Granulocytes: 0.02 10*3/uL (ref 0.00–0.07)
Basophils Absolute: 0.1 10*3/uL (ref 0.0–0.1)
Basophils Relative: 1 %
Eosinophils Absolute: 0.1 10*3/uL (ref 0.0–0.5)
Eosinophils Relative: 1 %
HCT: 33.5 % — ABNORMAL LOW (ref 36.0–46.0)
Hemoglobin: 11 g/dL — ABNORMAL LOW (ref 12.0–15.0)
Immature Granulocytes: 0 %
Lymphocytes Relative: 22 %
Lymphs Abs: 1.5 10*3/uL (ref 0.7–4.0)
MCH: 29.3 pg (ref 26.0–34.0)
MCHC: 32.8 g/dL (ref 30.0–36.0)
MCV: 89.3 fL (ref 80.0–100.0)
Monocytes Absolute: 0.9 10*3/uL (ref 0.1–1.0)
Monocytes Relative: 13 %
Neutro Abs: 4.2 10*3/uL (ref 1.7–7.7)
Neutrophils Relative %: 63 %
Platelets: 411 10*3/uL — ABNORMAL HIGH (ref 150–400)
RBC: 3.75 MIL/uL — ABNORMAL LOW (ref 3.87–5.11)
RDW: 15.5 % (ref 11.5–15.5)
WBC: 6.7 10*3/uL (ref 4.0–10.5)
nRBC: 0 % (ref 0.0–0.2)

## 2021-09-13 LAB — COMPREHENSIVE METABOLIC PANEL
ALT: 9 U/L (ref 0–44)
AST: 13 U/L — ABNORMAL LOW (ref 15–41)
Albumin: 2.6 g/dL — ABNORMAL LOW (ref 3.5–5.0)
Alkaline Phosphatase: 62 U/L (ref 38–126)
Anion gap: 6 (ref 5–15)
BUN: 12 mg/dL (ref 8–23)
CO2: 29 mmol/L (ref 22–32)
Calcium: 8.7 mg/dL — ABNORMAL LOW (ref 8.9–10.3)
Chloride: 94 mmol/L — ABNORMAL LOW (ref 98–111)
Creatinine, Ser: 0.7 mg/dL (ref 0.44–1.00)
GFR, Estimated: >60 mL/min (ref >60)
Glucose, Bld: 101 mg/dL — ABNORMAL HIGH (ref 70–99)
Potassium: 4 mmol/L (ref 3.5–5.1)
Sodium: 129 mmol/L — ABNORMAL LOW (ref 135–145)
Total Bilirubin: 0.7 mg/dL (ref 0.3–1.2)
Total Protein: 5.6 g/dL — ABNORMAL LOW (ref 6.5–8.1)

## 2021-09-13 LAB — MAGNESIUM: Magnesium: 1.6 mg/dL — ABNORMAL LOW (ref 1.7–2.4)

## 2021-09-13 LAB — PHOSPHORUS: Phosphorus: 4 mg/dL (ref 2.5–4.6)

## 2021-09-13 MED ORDER — AMLODIPINE BESYLATE 10 MG PO TABS
10.0000 mg | ORAL_TABLET | Freq: Every day | ORAL | 0 refills | Status: DC
Start: 2021-09-14 — End: 2023-10-26

## 2021-09-13 MED ORDER — BISACODYL 10 MG RE SUPP
10.0000 mg | Freq: Once | RECTAL | Status: AC
  Administered 2021-09-13: 10 mg via RECTAL
  Filled 2021-09-13: qty 1

## 2021-09-13 MED ORDER — MAGNESIUM SULFATE 2 GM/50ML IV SOLN
INTRAVENOUS | Status: AC
  Filled 2021-09-13: qty 50

## 2021-09-13 MED ORDER — CARVEDILOL 3.125 MG PO TABS
3.1250 mg | ORAL_TABLET | Freq: Two times a day (BID) | ORAL | 0 refills | Status: DC
Start: 2021-09-13 — End: 2023-10-26

## 2021-09-13 MED ORDER — MAGNESIUM HYDROXIDE 400 MG/5ML PO SUSP: 960.0000 mL | Freq: Once | ORAL | Status: DC

## 2021-09-13 MED ORDER — POLYETHYLENE GLYCOL 3350 17 G PO PACK
17.0000 g | PACK | Freq: Two times a day (BID) | ORAL | Status: DC
  Administered 2021-09-13: 17 g via ORAL
  Filled 2021-09-13: qty 1

## 2021-09-13 MED ORDER — ATORVASTATIN CALCIUM 40 MG PO TABS: 40.0000 mg | ORAL_TABLET | Freq: Every day | ORAL | 0 refills | Status: AC

## 2021-09-13 MED ORDER — DULOXETINE HCL 30 MG PO CPEP: 30.0000 mg | ORAL_CAPSULE | Freq: Every day | ORAL | 0 refills | Status: AC

## 2021-09-13 MED ORDER — POLYETHYLENE GLYCOL 3350 17 G PO PACK: 17.0000 g | PACK | Freq: Every day | ORAL | 0 refills | Status: AC

## 2021-09-13 MED ORDER — PREGABALIN 25 MG PO CAPS: 25.0000 mg | ORAL_CAPSULE | Freq: Every day | ORAL | 0 refills | Status: AC

## 2021-09-13 MED ORDER — MAGNESIUM SULFATE 2 GM/50ML IV SOLN
2.0000 g | Freq: Once | INTRAVENOUS | Status: AC
  Administered 2021-09-13: 2 g via INTRAVENOUS
  Filled 2021-09-13: qty 50

## 2021-09-13 MED ORDER — ASPIRIN 81 MG PO TBEC
81.0000 mg | DELAYED_RELEASE_TABLET | Freq: Every day | ORAL | 11 refills | Status: AC
Start: 2021-09-14 — End: ?

## 2021-09-13 NOTE — Progress Notes (Signed)
Mobility Specialist Progress Note ? ? 09/13/21 1253  ?Mobility  ?Activity Ambulated independently in room;Ambulated with assistance to bathroom  ?Level of Assistance Standby assist, set-up cues, supervision of patient - no hands on  ?Assistive Device Front wheel walker  ?Distance Ambulated (ft) 120 ft  ?Activity Response Tolerated well  ?$Mobility charge 1 Mobility  ? ?Pre Mobility: 71 HR ?During Mobility: 87 HR ?Post Mobility: 74 HR ? ?Received in bed c/o BLE pain and requiring min encouragement. Requesting to use BR prior to ambulating. Successful void. No fault during ambulation but fatigue very quickly and requesting to turn back. Left in bed w/ call bell in reach and RN present in room. ? ?Holland Falling ?Mobility Specialist ?Phone Number [phone] ? ?

## 2021-09-13 NOTE — Discharge Summary (Signed)
Physician Discharge Summary  ?Kathryn Fowler [id number] DOB: July 11, 1955 DOA: 09/10/2021 ? ?PCP: Cyndi Bender, PA-C ? ?Admit date: 09/10/2021 ?Discharge date: 09/13/2021 ? ?Time spent: 40 minutes ? ?Recommendations for Outpatient Follow-up:  ?Follow outpatient CBC/CMP  ?Follow with vascular outpatient ?Follow chronic back and lower extremity pain outpatient - sounds at least like there's component of neuropathic pain? Needs additional workup.  Consider neurology follow up. ?Elevated LDL, started on lipitor.  Follow LDL outpatient, not at goal.  ?Follow hyponatremia outpatient ?Follow blood pressure outpatient, adjust further as indicated ? ?Discharge Diagnoses:  ?Principal Problem: ?  Lower extremity pain ?Active Problems: ?  Back pain ?  Essential hypertension ?  Hyponatremia ?  PVD (peripheral vascular disease) ([place]) ?  Pleural effusion ?  Dyslipidemia ?  Lower limb ischemia ? ? ?Discharge Condition: stable ? ?Diet recommendation: heart healthy ? ?Filed Weights  ? 09/10/21 1545  ?Weight: 55.7 kg  ? ? ?History of present illness:  ?Kathryn Fowler is Aalijah Lanphere 66 y.o. female with medical history significant of severe PVD status post bilateral aorto-femoral artery bypass February 2023, HTN, hyponatremia, HLD, came with worsening of B/L groin and thigh pain. ?  ?Symptoms started more than 1 month ago, [place] getting worse, pain has been dull like, worsening with ambulation.  About 1 week ago, the pain has progressed to lower abdomen, associated with nauseous but no vomiting no diarrhea.  No fever or chills.  Patient reported that her both feet always feel cold, but denies any discoloration of any other toes.  He denied Rosalene Wardrop history of Jael Kostick-fib. ?  ?ED Course: Blood pressure elevated.  CT angiogram showed complete occlusion of the right femoral artery 1.5 cm distal to the bifurcation, recommending further CTA to evaluate.  Vascular surgery consulted. ? ?She's now s/p angiogram which showed chronic R SFA occlusion.  They doubt  vascular etiology to pain.  Pain seems to be chronic and neuropathic in description, started on lyrica and cymbalta.  Consider neurology follow up outpatient. ? ?See below for additional details ? ?Hospital Course:  ?Assessment and Plan: ?* Lower extremity pain ?C/o lower abdominal and LE pain ?CT with interval aortobifermoral bypass graft which is patent from takeoff to touchdown site at the femoral arteries.  Complete occlusion of the R femoral artery 1.5 cm distal to bifurcation, consider further eval with CTA pelvis/runoff.  Mild to moderate focal stenosis of the origin of the superior mesenteric artery.  Mild narrowing of bilateral renal arteries.  Small bilateral effusions, R>L. ?Echo with EF 60-65%, no RWMA, moderate asymmetric LVH of basal septal segment, grade 1 diastolic dysfunction ?Appreciate vascular surgery, s/p ultrasound guided cannulation L limb of aortobifemoral bypass graft, aortogram with bilateral LE runoff -> no intervention undertaken, suspect pain multifactorial ?Chronic R SFA occlusion ?Pain described sounds neuropathic -> ?Will start lyrica (she doesn't want gabapentin) and cymbalta (for chronic pain).  Consider neurology follow up outpatient. ?PT eval ? ? ?Back pain ?Chronic 08/2021 MRI of lumbar spine with bilateral sacral alar insufficiency fractures, L>R as well as multilevel degenerative changes of the lumbar spine.  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 ?Hyponatremia ?Mild, follow ? ?Essential hypertension ?Significantly elevated ?Now improved ?Follow now on amlodipine, transitioned to coreg (from metop).  Resume lasix and losartan at discharge ? ?PVD (peripheral vascular disease) ([place]) ?S/p aorto bifemoral bypass 06/2021 ?Aspirin, lipitor ? ?Pleural effusion ?Small bilateral effusions R>L ?CXR today with mild bibasilar atelectasis, improvement in  small R effusion -> possible PTX, follow repeat x ray (negative for  ptx) ? ?Dyslipidemia ?lipitor at discharge ? ? ? ? ? ? ?Procedures: ?Echo ?IMPRESSIONS  ? ? ? 1. Left ventricular ejection fraction, by estimation, is 60 to 65%. Left  ?ventricular ejection fraction by 2D MOD biplane is 63.7 %. The left  ?ventricle has normal function. The left ventricle has no regional wall  ?motion abnormalities. There is moderate  ?asymmetric left ventricular hypertrophy of the basal-septal segment (13  ?mm). Left ventricular diastolic parameters are consistent with Grade I  ?diastolic dysfunction (impaired relaxation).  ? 2. Right ventricular systolic function is normal. The right ventricular  ?size is normal. There is normal pulmonary artery systolic pressure. The  ?estimated right ventricular systolic pressure is [id number] mmHg.  ? 3. Left atrial size was mildly dilated.  ? 4. The mitral valve is grossly normal. Trivial mitral valve  ?regurgitation. No evidence of mitral stenosis.  ? 5. The aortic valve is tricuspid. There is mild calcification of the  ?aortic valve. Aortic valve regurgitation is not visualized. Aortic valve  ?sclerosis/calcification is present, without any evidence of aortic  ?stenosis.  ? 6. The inferior vena cava is dilated in size with <50% respiratory  ?variability, suggesting right atrial pressure of 15 mmHg.   ? ?Procedure Performed: ?1.  Ultrasound-guided cannulation left limb of aortobifemoral bypass graft ?2.  Aortogram with bilateral lower extremity runoff ?3.  Moderate sedation with fentanyl and Versed for 36 minutes ?4.  Mynx device closure left common femoral artery ?Consultations: ?vascular ? ?Discharge Exam: ?Vitals:  ? 09/13/21 0745 09/13/21 1450  ?BP: (!) 157/86 (!) 148/80  ?Pulse: 82 79  ?Resp: 14 18  ?Temp: 98.3 ?F (36.8 ?C) 98.1 ?F (36.7 ?C)  ?SpO2: 92% 90%  ? ?Continue LE pain ?Describes as chronic (months to Blakeley Scheier year) - like walking on glass, feels funny ?Denies saddle anesthesia.  Denies incontinence. ?Discussed d/c plan ? ?General: No acute  distress. ?Cardiovascular: RRR ?Lungs:unlabored ?Abdomen: Soft, nontender, nondistended ?Neurological: Alert and oriented ?3. Moves all extremities ?4 with equal strength to lower extremities bilaterally. Cranial nerves II through XII grossly intact. ?Skin: Warm and dry. No rashes or lesions. ?Extremities: No clubbing or cyanosis. No edema. Palpable pedal pulses.   ? ?Discharge Instructions ? ? ?Discharge Instructions   ? ? Call MD for:  difficulty breathing, headache or visual disturbances   Complete by: As directed ?  ? Call MD for:  extreme fatigue   Complete by: As directed ?  ? Call MD for:  hives   Complete by: As directed ?  ? Call MD for:  persistant dizziness or light-headedness   Complete by: As directed ?  ? Call MD for:  persistant nausea and vomiting   Complete by: As directed ?  ? Call MD for:  redness, tenderness, or signs of infection (pain, swelling, redness, odor or green/yellow discharge around incision site)   Complete by: As directed ?  ? Call MD for:  severe uncontrolled pain   Complete by: As directed ?  ? Call MD for:  temperature >100.4   Complete by: As directed ?  ? Diet - low sodium heart healthy   Complete by: As directed ?  ? Discharge instructions   Complete by: As directed ?  ? You were seen for lower extremity pain.  ? ?The cause of this is unclear, but it's likely the result of multiple different issues. ? ?You had an angiogram with vascular surgery which  showed Ivis Henneman chronic right superficial femoral artery occlusion.  The way that you describe your pain is neuropathic (burning, walking on glass, etc). ? ?We'll start you on lyrica (similar to gabapentin, but helpful with neuropathic/nerve pain).  We'll also start you on cymbalta which can be helpful in chronic pain.  Follow up with your PCP for additional recommendations and adjustments to this recommend.  You may need to see neurology outpatient for this pain.  ? ?We've adjusted your medicines.  You've been started on amlodipine and  coreg.  Continue your losartan and lasix.  We've started atorvastatin and aspirin for you.  Stop your metoprolol.   ? ?Continue to follow up with your PCP to work this up further. ? ?Return for new, recurrent, or worsening symptoms.

## 2021-09-13 NOTE — Progress Notes (Addendum)
?  Progress Note ? ? ? ?09/13/2021 ?7:34 AM ?1 Day Post-Op ? ?Subjective:  persistent pain in both legs ? ? ?Vitals:  ? 09/13/21 0400 09/13/21 0409  ?BP:  (!) 146/69  ?Pulse:  80  ?Resp: 20 18  ?Temp:  97.8 ?F (36.6 ?C)  ?SpO2:  93%  ? ?Physical Exam: ?Lungs:  non labored ?Incisions:  L groin without hematoma ?Extremities:  palpable L DP pulse; brisk R DP signal by doppler ?Abdomen:  abd soft ?Neurologic: A&O ? ?CBC ?   ?Component Value Date/Time  ? WBC 6.7 09/13/2021 0102  ? RBC 3.75 (L) 09/13/2021 0102  ? HGB 11.0 (L) 09/13/2021 0102  ? HCT 33.5 (L) 09/13/2021 0102  ? PLT 411 (H) 09/13/2021 0102  ? MCV 89.3 09/13/2021 0102  ? MCH 29.3 09/13/2021 0102  ? MCHC 32.8 09/13/2021 0102  ? RDW 15.5 09/13/2021 0102  ? LYMPHSABS 1.5 09/13/2021 0102  ? MONOABS 0.9 09/13/2021 0102  ? EOSABS 0.1 09/13/2021 0102  ? BASOSABS 0.1 09/13/2021 0102  ? ? ?BMET ?   ?Component Value Date/Time  ? NA 129 (L) 09/13/2021 0102  ? K 4.0 09/13/2021 0102  ? CL 94 (L) 09/13/2021 0102  ? CO2 29 09/13/2021 0102  ? GLUCOSE 101 (H) 09/13/2021 0102  ? BUN 12 09/13/2021 0102  ? CREATININE 0.70 09/13/2021 0102  ? CALCIUM 8.7 (L) 09/13/2021 0102  ? GFRNONAA >60 09/13/2021 0102  ? ? ?INR ?   ?Component Value Date/Time  ? INR 1.1 06/21/2021 1208  ? ? ? ?Intake/Output Summary (Last 24 hours) at 09/13/2021 0734 ?Last data filed at 09/12/2021 1814 ?Gross per 24 hour  ?Intake --  ?Output 200 ml  ?Net -200 ml  ? ? ? ?Assessment/Plan:  66 y.o. female is s/p angiogram 1 Day Post-Op  ? ?Angiogram demonstrated chronic R SFA occlusion.  No changes compared to CTA performed prior to ABF surgery.  No hemodynamically significant stenosis LLE.  She has brisk doppler flow in R LE and palpable L DP.  Etiology of BLE pain is unclear.  [facility] for discharge from vascular standpoint. ? ? ? ?Dagoberto Ligas, PA-C ?[facility] ?[phone] ?09/13/2021 ?7:34 AM ? ? ?I have independently interviewed examined patient and agree with PA assessment and plan above.  Pain does  not appear to be vascular in etiology.  Plan will be for interval follow-up in our office. ? ?Hinata Diener C. Donzetta Matters, MD ?[facility] ?Office: [phone] ?Pager: [phone] ? ?

## 2021-09-13 NOTE — Evaluation (Signed)
Physical Therapy Evaluation ?Patient Details ?Name: Kathryn Fowler ?MRN: [id number] ?DOB: Apr 17, 1956 ?Today's Date: 09/13/2021 ? ?History of Present Illness ? Pt is a 66 y/o female admitted secondary to abdominal and LE pain. Pt is s/p angiogram on 5/8. PMH includes carotid artery stenosis, HLD, HTN, osteoporosis, PAD s/p aortobifemoral bypass graft.  ?Clinical Impression ? Patient evaluated by Physical Therapy with no further acute PT needs identified. All education has been completed and the patient has no further questions. Pt overall at a mod I to supervision level for mobility tasks using RW. No overt LOB noted. Pt reporting increased pain which limited mobility. Reports family can assist as needed. Further mobility needs to be deferred to mobility specialist team. See below for any follow-up Physical Therapy or equipment needs. PT is signing off. Thank you for this referral. If needs change, please re-consult.  ?   ?   ? ?Recommendations for follow up therapy are one component of a multi-disciplinary discharge planning process, led by the attending physician.  Recommendations may be updated based on patient status, additional functional criteria and insurance authorization. ? ?Follow Up Recommendations No PT follow up ? ?  ?Assistance Recommended at Discharge Intermittent Supervision/Assistance  ?Patient can return home with the following ? Assistance with cooking/housework;Assist for transportation ? ?  ?Equipment Recommendations None recommended by PT  ?Recommendations for Other Services ?    ?  ?Functional Status Assessment Patient has had a recent decline in their functional status and demonstrates the ability to make significant improvements in function in a reasonable and predictable amount of time.  ? ?  ?Precautions / Restrictions Precautions ?Precautions: None ?Restrictions ?Weight Bearing Restrictions: No  ? ?  ? ?Mobility ? Bed Mobility ?Overal bed mobility: Modified Independent ?  ?  ?  ?  ?  ?  ?  ?   ? ?Transfers ?Overall transfer level: Modified independent ?  ?  ?  ?  ?  ?  ?  ?  ?  ?  ? ?Ambulation/Gait ?Ambulation/Gait assistance: Supervision ?Gait Distance (Feet): 150 Feet ?Assistive device: Rolling walker (2 wheels) ?Gait Pattern/deviations: Step-through pattern, Decreased stride length ?Gait velocity: Decreased ?  ?  ?General Gait Details: Slow, cautious gait. No LOB noted with use of RW. ? ?Stairs ?Stairs:  (pt refusing to practice) ?  ?  ?  ?  ? ?Wheelchair Mobility ?  ? ?Modified Rankin (Stroke Patients Only) ?  ? ?  ? ?Balance Overall balance assessment: Mild deficits observed, not formally tested ?  ?  ?  ?  ?  ?  ?  ?  ?  ?  ?  ?  ?  ?  ?  ?  ?  ?  ?   ? ? ? ?Pertinent Vitals/Pain Pain Assessment ?Pain Assessment: Faces ?Faces Pain Scale: Hurts little more ?Pain Location: BLE pain ?Pain Descriptors / Indicators: Grimacing, Guarding ?Pain Intervention(s): Limited activity within patient's tolerance, Monitored during session, Repositioned  ? ? ?Home Living Family/patient expects to be discharged to:: Private residence ?Living Arrangements: Spouse/significant other;Other relatives ?Available Help at Discharge: Family;Available 24 hours/day ?Type of Home: House ?Home Access: Ramped entrance ?  ?  ?  ?Home Layout: One level ?Home Equipment: Rollator (4 wheels);BSC/3in1;Shower seat;Grab bars - tub/shower;Rolling Walker (2 wheels) ?   ?  ?Prior Function Prior Level of Function : Independent/Modified Independent ?  ?  ?  ?  ?  ?  ?Mobility Comments: uses cane vs RW at home ?  ?  ? ? ?  Hand Dominance  ?   ? ?  ?Extremity/Trunk Assessment  ? Upper Extremity Assessment ?Upper Extremity Assessment: Overall WFL for tasks assessed ?  ? ?Lower Extremity Assessment ?Lower Extremity Assessment: Generalized weakness (increased pain in BLE) ?  ? ?Cervical / Trunk Assessment ?Cervical / Trunk Assessment: Normal  ?Communication  ? Communication: No difficulties  ?Cognition Arousal/Alertness: Awake/alert ?Behavior During  Therapy: [facility] for tasks assessed/performed ?Overall Cognitive Status: Within Functional Limits for tasks assessed ?  ?  ?  ?  ?  ?  ?  ?  ?  ?  ?  ?  ?  ?  ?  ?  ?  ?  ?  ? ?  ?General Comments   ? ?  ?Exercises    ? ?Assessment/Plan  ?  ?PT Assessment Patient does not need any further PT services  ?PT Problem List   ? ?   ?  ?PT Treatment Interventions     ? ?PT Goals (Current goals can be found in the Care Plan section)  ?Acute Rehab PT Goals ?Patient Stated Goal: to go home ?PT Goal Formulation: With patient ?Time For Goal Achievement: 09/13/21 ?Potential to Achieve Goals: Good ? ?  ?Frequency   ?  ? ? ?Co-evaluation   ?  ?  ?  ?  ? ? ?  ?AM-PAC PT "6 Clicks" Mobility  ?Outcome Measure Help needed turning from your back to your side while in a flat bed without using bedrails?: None ?Help needed moving from lying on your back to sitting on the side of a flat bed without using bedrails?: None ?Help needed moving to and from a bed to a chair (including a wheelchair)?: None ?Help needed standing up from a chair using your arms (e.g., wheelchair or bedside chair)?: None ?Help needed to walk in hospital room?: A Little ?Help needed climbing 3-5 steps with a railing? : A Little ?6 Click Score: 22 ? ?  ?End of Session   ?Activity Tolerance: Patient limited by pain ?Patient left: in bed;with call bell/phone within reach;with family/visitor present ?Nurse Communication: Mobility status ?PT Visit Diagnosis: Other abnormalities of gait and mobility (R26.89);Pain ?Pain - Right/Left:  (bilateral) ?Pain - part of body: Leg ?  ? ?Time: [id number] ?PT Time Calculation (min) (ACUTE ONLY): 13 min ? ? ?Charges:   PT Evaluation ?$PT Eval Low Complexity: 1 Low ?  ?  ?   ? ? ?Reuel Derby, PT, DPT  ?Acute Rehabilitation Services  ?Pager: [phone] ?Office: [phone] ? ? ?[place] ?09/13/2021, 1:56 PM ? ?

## 2021-09-13 NOTE — Assessment & Plan Note (Signed)
lipitor at discharge ?

## 2021-09-20 ENCOUNTER — Ambulatory Visit: Payer: BC Managed Care – PPO | Admitting: Physician Assistant

## 2021-09-27 ENCOUNTER — Other Ambulatory Visit: Payer: BC Managed Care – PPO

## 2021-10-26 ENCOUNTER — Ambulatory Visit: Payer: BC Managed Care – PPO

## 2021-10-26 ENCOUNTER — Encounter (HOSPITAL_COMMUNITY): Payer: BC Managed Care – PPO

## 2021-10-26 NOTE — Progress Notes (Signed)
Office Note     CC:  follow up Requesting Provider:  Lonie Peak, PA-C  HPI: Kathryn Fowler is a 66 y.o. (November 12, 1955) female who presents for follow up after Aortogram with bilateral lower extremity runoff on 09/12/21 by Dr. Randie Heinz. No intervention was performed. The findings are as stated below: Aortobifemoral bypass does have twisted limbs but  there is a strong palpable right common femoral pulse.  The graft is patent to both common femoral arteries.  On the right side the SFA occludes shortly after the takeoff is occluded for a long segment reconstitutes above the knee popliteal artery and then has runoff via the anterior tibial artery and the dominant peroneal.  On the left side the SFA is patent all the way to the popliteal artery and has three-vessel runoff dominant via the anterior tibial artery.   Today she reports continued numbness and tingling in both her feet and legs. She does not describe claudication or rest pain. She has no tissue loss. She says the numbness and tingling make it unsteady for her to walk because she cannot feel her feet well. She has been out of work for a little while now secondary to this.  She otherwise is compliant with her aspirin and statin.   The pt is on a statin for cholesterol management.  The pt is on a daily aspirin.   Other AC:  none The pt is on ARB, BB, CCB for hypertension.   The pt is not diabetic.   Tobacco hx:  Former, February 2023  Past Medical History:  Diagnosis Date   Arthritis    B12 deficiency    Carotid artery stenosis    1-39% BICA stenosis 04/20/21 [place]   Dyspnea    Esophageal reflux    History of tobacco abuse    Hyperlipidemia    Hypertension    Hyponatremia    Osteoporosis    PAD (peripheral artery disease) (HCC)    Polycythemia     Past Surgical History:  Procedure Laterality Date   AORTA - BILATERAL FEMORAL ARTERY BYPASS GRAFT N/A 06/21/2021   Procedure: AORTOBIFEMORAL BYPASS GRAFT;  Surgeon: Maeola Harman, MD;  Location: [facility] OR;  Service: Vascular;  Laterality: N/A;   LOWER EXTREMITY ANGIOGRAPHY N/A 09/12/2021   Procedure: Lower Extremity Angiography;  Surgeon: Maeola Harman, MD;  Location: [facility] INVASIVE CV LAB;  Service: Cardiovascular;  Laterality: N/A;    Social History   Socioeconomic History   Marital status: Married    Spouse name: Not on file   Number of children: Not on file   Years of education: Not on file   Highest education level: Not on file  Occupational History   Not on file  Tobacco Use   Smoking status: Former    Packs/day: 0.25    Types: Cigarettes    Quit date: 06/2021    Years since quitting: 0.4   Smokeless tobacco: Not on file  Vaping Use   Vaping Use: Never used  Substance and Sexual Activity   Alcohol use: Yes    Alcohol/week: 28.0 standard drinks of alcohol    Types: 28 Cans of beer per week   Drug use: Yes    Types: Marijuana   Sexual activity: Not on file  Other Topics Concern   Not on file  Social History Narrative   Not on file   Social Determinants of Health   Financial Resource Strain: Not on file  Food Insecurity: Not on file  Transportation  Needs: Not on file  Physical Activity: Not on file  Stress: Not on file  Social Connections: Not on file  Intimate Partner Violence: Not on file    Family History  Problem Relation Age of Onset   Cancer Mother     Current Outpatient Medications  Medication Sig Dispense Refill   aspirin EC 81 MG EC tablet Take 1 tablet (81 mg total) by mouth daily. Swallow whole. 30 tablet 11   cilostazol (PLETAL) 100 MG tablet Take 100 mg by mouth 2 (two) times daily.     furosemide (LASIX) 20 MG tablet Take 20 mg by mouth daily.     losartan (COZAAR) 100 MG tablet Take 100 mg by mouth daily.     polyethylene glycol (MIRALAX) 17 g packet Take 17 g by mouth daily. 14 each 0   potassium chloride (KLOR-CON) 10 MEQ tablet Take 10 mEq by mouth daily.     amLODipine (NORVASC) 10 MG tablet Take  1 tablet (10 mg total) by mouth daily. 30 tablet 0   atorvastatin (LIPITOR) 40 MG tablet Take 1 tablet (40 mg total) by mouth daily. 40 tablet 0   carvedilol (COREG) 3.125 MG tablet Take 1 tablet (3.125 mg total) by mouth 2 (two) times daily with a meal. 60 tablet 0   DULoxetine (CYMBALTA) 30 MG capsule Take 1 capsule (30 mg total) by mouth daily. Follow with your PCP for adjustment for chronic pain 30 capsule 0   nicotine (NICODERM CQ - DOSED IN MG/24 HOURS) 14 mg/24hr patch Place 1 patch (14 mg total) onto the skin daily. (Patient not taking: Reported on 07/20/2021) 28 patch 0   oxyCODONE-acetaminophen (PERCOCET/ROXICET) 5-325 MG tablet Take 1 tablet by mouth every 4 (four) hours as needed for severe pain. (Patient not taking: Reported on 11/02/2021) 30 tablet 0   pregabalin (LYRICA) 25 MG capsule Take 1 capsule (25 mg total) by mouth at bedtime. Follow with your PCP for adjustment 30 capsule 0   No current facility-administered medications for this visit.    No Known Allergies   REVIEW OF SYSTEMS:  [X]  denotes positive finding, [ ]  denotes negative finding Cardiac  Comments:  Chest pain or chest pressure:    Shortness of breath upon exertion:    Short of breath when lying flat:    Irregular heart rhythm:        Vascular    Pain in calf, thigh, or hip brought on by ambulation:    Pain in feet at night that wakes you up from your sleep:     Blood clot in your veins:    Leg swelling:         Pulmonary    Oxygen at home:    Productive cough:     Wheezing:         Neurologic    Sudden weakness in arms or legs:     Sudden numbness in arms or legs:     Sudden onset of difficulty speaking or slurred speech:    Temporary loss of vision in one eye:     Problems with dizziness:         Gastrointestinal    Blood in stool:     Vomited blood:         Genitourinary    Burning when urinating:     Blood in urine:        Psychiatric    Major depression:         Hematologic  Bleeding problems:    Problems with blood clotting too easily:        Skin    Rashes or ulcers:        Constitutional    Fever or chills:      PHYSICAL EXAMINATION:  Vitals:   11/02/21 0902  BP: (!) 158/75  Pulse: 66  Resp: 20  Temp: 98.1 F (36.7 C)  TempSrc: Oral  SpO2: 94%  Weight: 118 lb 3.2 oz (53.6 kg)  Height: 5\' 2"  (1.575 m)    General:  WDWN in NAD; vital signs documented above Gait: Normal HENT: WNL, normocephalic Pulmonary: normal non-labored breathing , without wheezing Cardiac: regular HR Abdomen: soft, NT, no masses Vascular Exam/Pulses:  Right Left  Radial 2+ (normal) 2+ (normal)  Femoral 2+ (normal) 2+ (normal)  Popliteal Not palpable Not palpable  DP 2+ (normal) 2+ (normal)  PT 1+ (weak) Not palpable   Extremities: without ischemic changes, without Gangrene , without cellulitis; without open wounds; Abdominal incision and bilateral groin incisions healed. Left groin incision mildly tender to palpation Musculoskeletal: no muscle wasting or atrophy  Neurologic: A&O X 3;  No focal weakness or paresthesias are detected Psychiatric:  The pt has Normal affect.   Non-Invasive Vascular Imaging:  10/28/21  +-------+-----------+-----------+------------+------------+  ABI/TBIToday's ABIToday's TBIPrevious ABIPrevious TBI  +-------+-----------+-----------+------------+------------+  Right  0.70       0.28                                 +-------+-----------+-----------+------------+------------+  Left   1.14       0.70                                 +-------+-----------+-----------+------------+------------+    ASSESSMENT/PLAN:: 66 y.o. female here for follow up after Aortogram with bilateral lower extremity runoff on 09/12/21 by Dr. 11/12/21. No intervention was taken. Aortobifemoral bypass patent. She has known right SFA occlusion.  Dr. Randie Heinz operative note stated that if the patient had right lower extremity symptoms only we could consider  right femoropopliteal bypass but at this time he feels her pain is multifactorial in nature as it is bilateral  - ABI today show normal flow on left, right moderate arterial disease with monophasic flow - her symptoms are presently stable and she does not have rest pain or tissue loss so no surgical intervention indicated at this time - discussed importance of keeping her feet protected and continue to ambulate as much as tolerated  - Continue Aspirin and statin  - She will follow up in 3 months with ABI - She knows to follow up sooner if she has any new or concerning symptoms    Randie Heinz, PA-C [facility] [phone]  Clinic MD:   [phone] / Edilia Bo

## 2021-10-28 ENCOUNTER — Ambulatory Visit (HOSPITAL_COMMUNITY)
Admission: RE | Admit: 2021-10-28 | Discharge: 2021-10-28 | Disposition: A | Discharge status: Home / Self Care | Payer: BC Managed Care – PPO | Source: Ambulatory Visit | Attending: Vascular Surgery | Admitting: Vascular Surgery

## 2021-10-28 DIAGNOSIS — I739 Peripheral vascular disease, unspecified: Secondary | ICD-10-CM | POA: Diagnosis present

## 2021-11-02 ENCOUNTER — Encounter: Payer: Self-pay | Admitting: Physician Assistant

## 2021-11-02 ENCOUNTER — Ambulatory Visit: Payer: BC Managed Care – PPO | Admitting: Physician Assistant

## 2021-11-02 VITALS — BP 158/75 | HR 66 | Temp 98.1°F | Resp 20 | Ht 62.0 in | Wt 118.2 lb

## 2021-11-02 DIAGNOSIS — I739 Peripheral vascular disease, unspecified: Secondary | ICD-10-CM | POA: Diagnosis not present

## 2021-11-10 ENCOUNTER — Other Ambulatory Visit: Payer: Self-pay

## 2021-11-10 DIAGNOSIS — I739 Peripheral vascular disease, unspecified: Secondary | ICD-10-CM

## 2022-01-26 ENCOUNTER — Other Ambulatory Visit: Payer: Self-pay | Admitting: *Deleted

## 2022-02-02 ENCOUNTER — Ambulatory Visit: Payer: Medicare HMO | Admitting: Physician Assistant

## 2022-02-02 ENCOUNTER — Ambulatory Visit (HOSPITAL_COMMUNITY)
Admission: RE | Admit: 2022-02-02 | Discharge: 2022-02-02 | Disposition: A | Discharge status: Home / Self Care | Payer: Medicare HMO | Source: Ambulatory Visit | Attending: Vascular Surgery | Admitting: Vascular Surgery

## 2022-02-02 VITALS — BP 156/89 | HR 71 | Temp 98.0°F | Resp 20 | Ht 62.0 in | Wt 123.8 lb

## 2022-02-02 DIAGNOSIS — I739 Peripheral vascular disease, unspecified: Secondary | ICD-10-CM

## 2022-02-02 NOTE — Progress Notes (Signed)
Office Note     CC:  follow up Requesting Provider:  Lonie Peak, PA-C  HPI: Kathryn Fowler is a 66 y.o. (28-Sep-1955) female who presents for surveillance of PAD.  She also underwent aortobifemoral bypass by Dr. Randie Heinz in February of this year.  She has known right SFA occlusion and underwent angiogram and may to further evaluate bilateral lower extremity outflow.  Patient states she has numbness and pain in her legs especially in her left leg.  She denies rest pain or tissue loss.  She is on aspirin and Lipitor daily.  She denies tobacco use.    Past Medical History:  Diagnosis Date   Arthritis    B12 deficiency    Carotid artery stenosis    1-39% BICA stenosis 04/20/21 [place]   Dyspnea    Esophageal reflux    History of tobacco abuse    Hyperlipidemia    Hypertension    Hyponatremia    Osteoporosis    PAD (peripheral artery disease) (HCC)    Polycythemia     Past Surgical History:  Procedure Laterality Date   AORTA - BILATERAL FEMORAL ARTERY BYPASS GRAFT N/A 06/21/2021   Procedure: AORTOBIFEMORAL BYPASS GRAFT;  Surgeon: Maeola Harman, MD;  Location: [facility] OR;  Service: Vascular;  Laterality: N/A;   LOWER EXTREMITY ANGIOGRAPHY N/A 09/12/2021   Procedure: Lower Extremity Angiography;  Surgeon: Maeola Harman, MD;  Location: [facility] INVASIVE CV LAB;  Service: Cardiovascular;  Laterality: N/A;    Social History   Socioeconomic History   Marital status: Married    Spouse name: Not on file   Number of children: Not on file   Years of education: Not on file   Highest education level: Not on file  Occupational History   Not on file  Tobacco Use   Smoking status: Former    Packs/day: 0.25    Types: Cigarettes    Quit date: 06/2021    Years since quitting: 0.6    Passive exposure: Never   Smokeless tobacco: Not on file  Vaping Use   Vaping Use: Never used  Substance and Sexual Activity   Alcohol use: Yes    Alcohol/week: 28.0 standard drinks of alcohol     Types: 28 Cans of beer per week   Drug use: Yes    Types: Marijuana   Sexual activity: Not on file  Other Topics Concern   Not on file  Social History Narrative   Not on file   Social Determinants of Health   Financial Resource Strain: Not on file  Food Insecurity: Not on file  Transportation Needs: Not on file  Physical Activity: Not on file  Stress: Not on file  Social Connections: Not on file  Intimate Partner Violence: Not on file    Family History  Problem Relation Age of Onset   Cancer Mother     Current Outpatient Medications  Medication Sig Dispense Refill   aspirin EC 81 MG EC tablet Take 1 tablet (81 mg total) by mouth daily. Swallow whole. 30 tablet 11   cilostazol (PLETAL) 100 MG tablet Take 100 mg by mouth 2 (two) times daily.     furosemide (LASIX) 20 MG tablet Take 20 mg by mouth daily.     losartan (COZAAR) 100 MG tablet Take 100 mg by mouth daily.     nicotine (NICODERM CQ - DOSED IN MG/24 HOURS) 14 mg/24hr patch Place 1 patch (14 mg total) onto the skin daily. 28 patch 0   oxyCODONE-acetaminophen (  PERCOCET/ROXICET) 5-325 MG tablet Take 1 tablet by mouth every 4 (four) hours as needed for severe pain. 30 tablet 0   polyethylene glycol (MIRALAX) 17 g packet Take 17 g by mouth daily. 14 each 0   potassium chloride (KLOR-CON) 10 MEQ tablet Take 10 mEq by mouth daily.     amLODipine (NORVASC) 10 MG tablet Take 1 tablet (10 mg total) by mouth daily. 30 tablet 0   atorvastatin (LIPITOR) 40 MG tablet Take 1 tablet (40 mg total) by mouth daily. 40 tablet 0   carvedilol (COREG) 3.125 MG tablet Take 1 tablet (3.125 mg total) by mouth 2 (two) times daily with a meal. 60 tablet 0   DULoxetine (CYMBALTA) 30 MG capsule Take 1 capsule (30 mg total) by mouth daily. Follow with your PCP for adjustment for chronic pain 30 capsule 0   pregabalin (LYRICA) 25 MG capsule Take 1 capsule (25 mg total) by mouth at bedtime. Follow with your PCP for adjustment 30 capsule 0   No  current facility-administered medications for this visit.    No Known Allergies   REVIEW OF SYSTEMS:   [X]  denotes positive finding, [ ]  denotes negative finding Cardiac  Comments:  Chest pain or chest pressure:    Shortness of breath upon exertion:    Short of breath when lying flat:    Irregular heart rhythm:        Vascular    Pain in calf, thigh, or hip brought on by ambulation:    Pain in feet at night that wakes you up from your sleep:     Blood clot in your veins:    Leg swelling:         Pulmonary    Oxygen at home:    Productive cough:     Wheezing:         Neurologic    Sudden weakness in arms or legs:     Sudden numbness in arms or legs:     Sudden onset of difficulty speaking or slurred speech:    Temporary loss of vision in one eye:     Problems with dizziness:         Gastrointestinal    Blood in stool:     Vomited blood:         Genitourinary    Burning when urinating:     Blood in urine:        Psychiatric    Major depression:         Hematologic    Bleeding problems:    Problems with blood clotting too easily:        Skin    Rashes or ulcers:        Constitutional    Fever or chills:      PHYSICAL EXAMINATION:  Vitals:   02/02/22 1314  BP: (!) 156/89  Pulse: 71  Resp: 20  Temp: 98 F (36.7 C)  TempSrc: Temporal  SpO2: 94%  Weight: 123 lb 12.8 oz (56.2 kg)  Height: 5\' 2"  (1.575 m)    General:  WDWN in NAD; vital signs documented above Gait: Not observed HENT: WNL, normocephalic Pulmonary: normal non-labored breathing , without Rales, rhonchi,  wheezing Cardiac: regular HR Abdomen: soft, NT, no masses Skin: without rashes Vascular Exam/Pulses:  Right Left  DP absent 1+ (weak)  PT absent 2+ (normal)   Extremities: without ischemic changes, without Gangrene , without cellulitis; without open wounds;  Musculoskeletal: no muscle wasting or atrophy  Neurologic:  A&O X 3;  No focal weakness or paresthesias are  detected Psychiatric:  The pt has Normal affect.   Non-Invasive Vascular Imaging:    ABI/TBIToday's ABIToday's TBIPrevious ABIPrevious TBI  +-------+-----------+-----------+------------+------------+  Right  0.48       0.36       0.70        0.28          +-------+-----------+-----------+------------+------------+  Left   0.83       0.54       1.14        0.70          +-------+-----------+-----------+------------+------------+   ASSESSMENT/PLAN:: 66 y.o. female here for follow up for surveillance of PAD  -Left lower extremity is well-perfused with palpable DP and PT pulses.  She has a known right SFA occlusion.  Dr. Donzetta Matters documented that he would consider right lower extremity femoral to popliteal bypass if she were to develop rest pain or tissue loss on the right leg however patient has more discomfort associated with her left leg.  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  I encouraged her to continue to walk and be active.  She will continue her aspirin and statin daily.  We will recheck ABIs in 1 year.  She will call/return office sooner with any questions or concerns.  Dagoberto Ligas, PA-C [facility] [phone]  Clinic MD:   Scot Dock

## 2023-02-07 ENCOUNTER — Encounter (HOSPITAL_COMMUNITY): Payer: Medicare HMO

## 2023-02-07 ENCOUNTER — Ambulatory Visit: Payer: Medicare HMO

## 2023-02-09 IMAGING — DX DG ABD PORTABLE 1V
1 series · 1 of 1 positions shown · non-contrast
Comparison: None.

CLINICAL DATA: Postop aortic bifemoral surgery.

EXAM:
PORTABLE ABDOMEN - 1 VIEW

[abdomen supine]
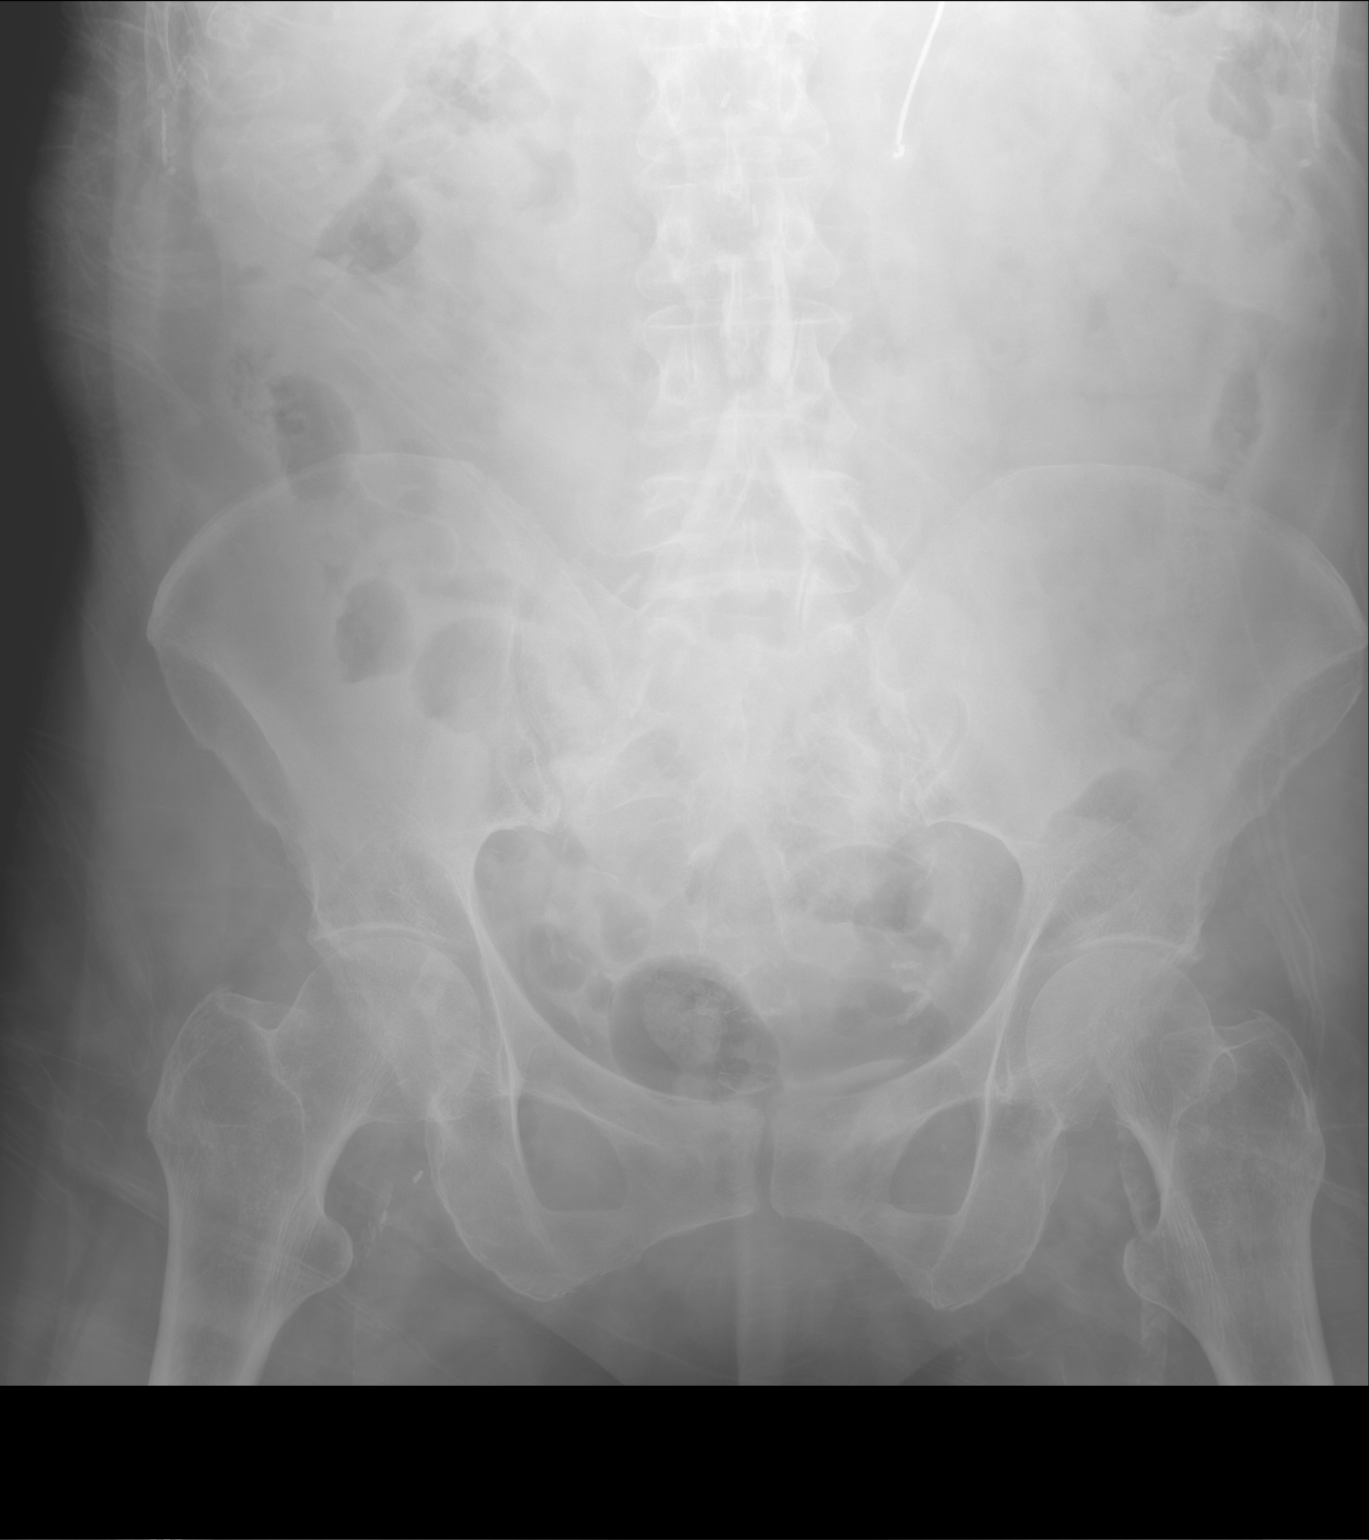

[1 of 1 positions shown; findings below may reference images not displayed]

FINDINGS: NG tip in the body the stomach. Normal bowel gas pattern. No ileus
or obstruction. Atherosclerotic calcification in the aorta and iliac
arteries.
IMPRESSION: No acute abnormality. Normal bowel gas pattern. NG tube in the body
the stomach

## 2023-02-09 IMAGING — DX DG CHEST 1V PORT
1 series · 1 of 1 positions shown · non-contrast
Comparison: None.

CLINICAL DATA: Postop aortic by femoral surgery.

EXAM:
PORTABLE CHEST 1 VIEW

[chest ap]
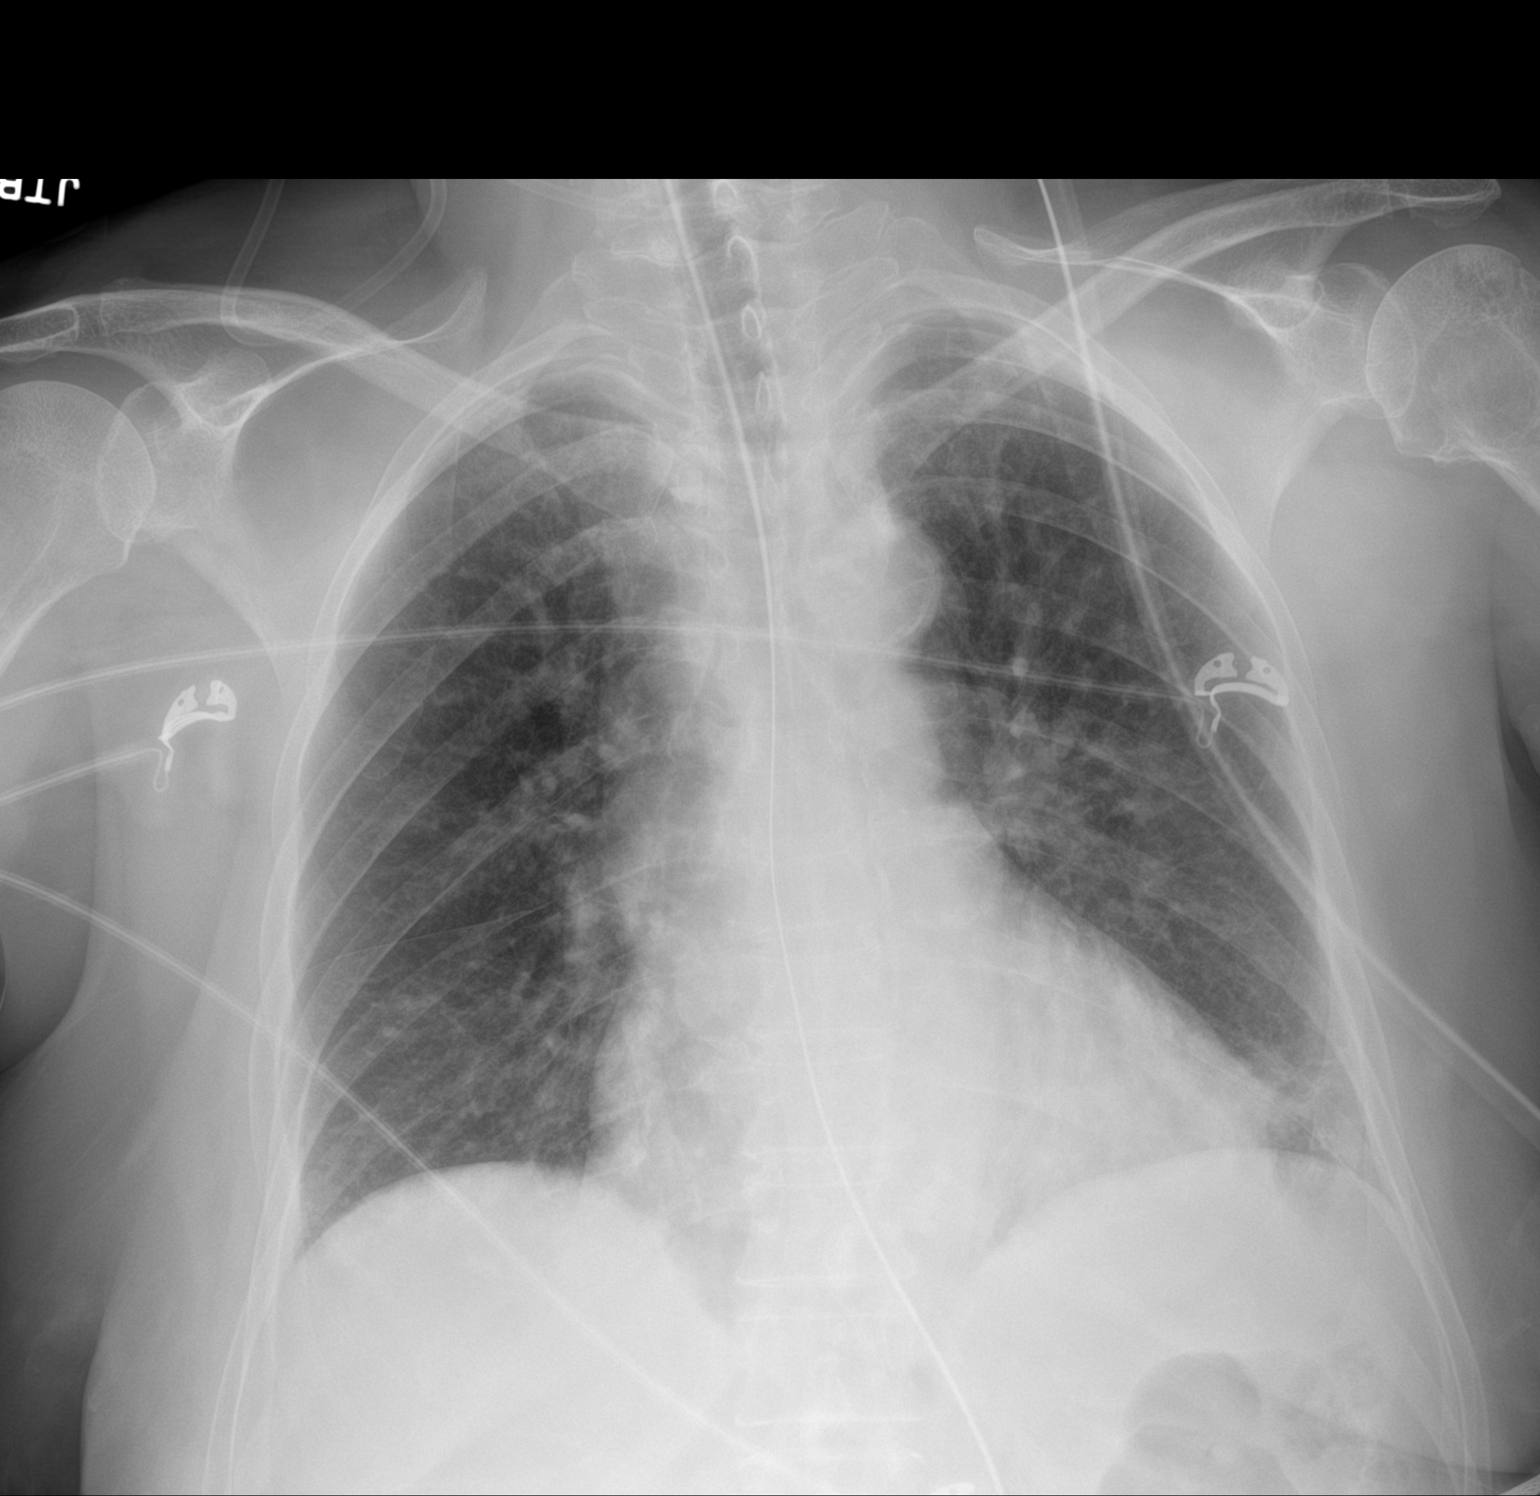

[1 of 1 positions shown; findings below may reference images not displayed]

FINDINGS: Heart size upper normal. Vascularity normal. Negative for heart
failure.

Mild left lower lobe airspace disease. Right lung clear. No
effusion.

NG tube enters the stomach with the tip not visualized.
IMPRESSION: Left lower lobe atelectasis/pneumonia.  No effusion or edema.

## 2023-02-10 IMAGING — DX DG CHEST 1V PORT
1 series · 1 of 1 positions shown · non-contrast
Comparison: 06/21/2021.

CLINICAL DATA: Chest soreness status post aortobifemoral bypass
graft.

EXAM:
PORTABLE CHEST 1 VIEW

[chest]
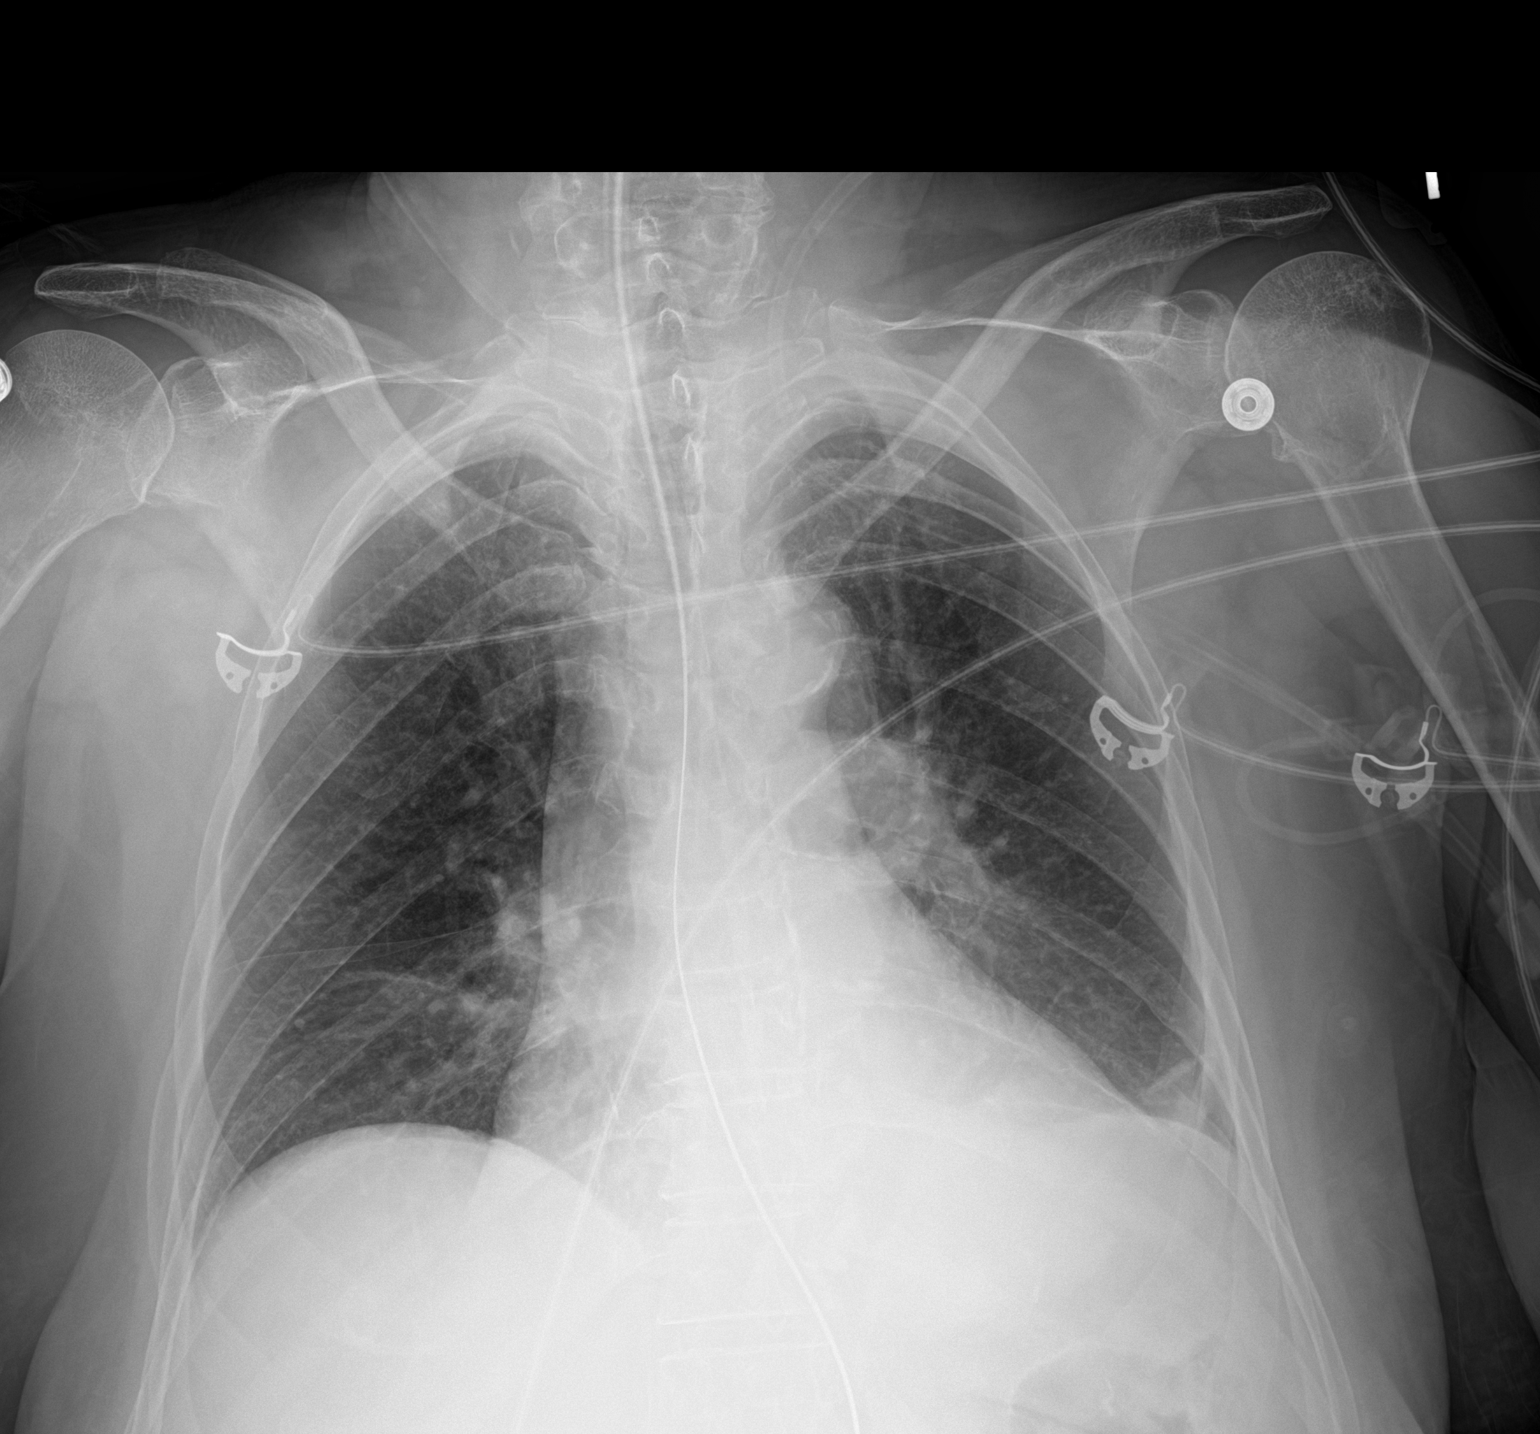

[1 of 1 positions shown; findings below may reference images not displayed]

FINDINGS: Nasogastric tube is followed into the stomach with the tip
projecting beyond the inferior margin of the image. Heart is
enlarged, stable. Thoracic aorta is calcified. Mild bibasilar
atelectasis. No definite pleural fluid. Old left humeral neck
fracture.
IMPRESSION: Bibasilar atelectasis.

## 2023-02-22 ENCOUNTER — Other Ambulatory Visit: Payer: Self-pay | Admitting: *Deleted

## 2023-02-22 DIAGNOSIS — I739 Peripheral vascular disease, unspecified: Secondary | ICD-10-CM

## 2023-03-07 ENCOUNTER — Ambulatory Visit (HOSPITAL_COMMUNITY)
Admission: RE | Admit: 2023-03-07 | Discharge: 2023-03-07 | Disposition: A | Discharge status: Home / Self Care | Payer: Medicare HMO | Source: Ambulatory Visit | Attending: Vascular Surgery | Admitting: Vascular Surgery

## 2023-03-07 ENCOUNTER — Ambulatory Visit: Payer: Medicare HMO | Admitting: Physician Assistant

## 2023-03-07 VITALS — BP 188/106 | HR 71 | Temp 98.3°F | Ht 62.0 in | Wt 141.0 lb

## 2023-03-07 DIAGNOSIS — I739 Peripheral vascular disease, unspecified: Secondary | ICD-10-CM

## 2023-03-07 LAB — VAS US ABI WITH/WO TBI
Left ABI: 1.03
Right ABI: 0.64

## 2023-03-07 NOTE — Progress Notes (Signed)
Office Note     CC:  follow up Requesting Provider:  Lonie Peak, PA-C  HPI: Kathryn Fowler is a 67 y.o. (06/25/1955) female who presents for surveillance follow up of PAD. She has recent history of aortobifemoral bypass by Dr. Randie Heinz on 06/21/21. Following this she had some complaints of bilateral lower extremity pain. She underwent a diagnostic Angiogram in May of 2023 which showed occluded right SFA with At and peroneal runoff. The left was patent throughout with three vessel runoff, with dominant AT. Since she has reported numbness and pain in her legs, but this has been felt to be multifactorial  Today she reports overall she is doing okay. She is suppose to be on Aspirin and Statin but reports only taking her statin. Says she was not aware she needed to be on Aspirin. She continues to have numbness especially in both her great toes. She otherwise denies any pain in her legs on ambulation or rest. No non healing wounds. She does report that she had difficulty with her bowls for a long time after her surgery but it is finally improved. She was put on some medications to help for a while. She denies any abdominal pain or back pain. She is a former smoker, quit in February of 2023.   Past Medical History:  Diagnosis Date   Arthritis    B12 deficiency    Carotid artery stenosis    1-39% BICA stenosis 04/20/21 [place]   Dyspnea    Esophageal reflux    History of tobacco abuse    Hyperlipidemia    Hypertension    Hyponatremia    Osteoporosis    PAD (peripheral artery disease) (HCC)    Polycythemia     Past Surgical History:  Procedure Laterality Date   AORTA - BILATERAL FEMORAL ARTERY BYPASS GRAFT N/A 06/21/2021   Procedure: AORTOBIFEMORAL BYPASS GRAFT;  Surgeon: Maeola Harman, MD;  Location: [facility] OR;  Service: Vascular;  Laterality: N/A;   LOWER EXTREMITY ANGIOGRAPHY N/A 09/12/2021   Procedure: Lower Extremity Angiography;  Surgeon: Maeola Harman, MD;  Location: [facility]  INVASIVE CV LAB;  Service: Cardiovascular;  Laterality: N/A;    Social History   Socioeconomic History   Marital status: Married    Spouse name: Not on file   Number of children: Not on file   Years of education: Not on file   Highest education level: Not on file  Occupational History   Not on file  Tobacco Use   Smoking status: Former    Current packs/day: 0.00    Types: Cigarettes    Quit date: 06/2021    Years since quitting: 1.7    Passive exposure: Never   Smokeless tobacco: Not on file  Vaping Use   Vaping status: Never Used  Substance and Sexual Activity   Alcohol use: Yes    Alcohol/week: 28.0 standard drinks of alcohol    Types: 28 Cans of beer per week   Drug use: Yes    Types: Marijuana   Sexual activity: Not on file  Other Topics Concern   Not on file  Social History Narrative   Not on file   Social Determinants of Health   Financial Resource Strain: Not on file  Food Insecurity: Not on file  Transportation Needs: Not on file  Physical Activity: Not on file  Stress: Not on file  Social Connections: Not on file  Intimate Partner Violence: Not on file    Family History  Problem Relation Age  of Onset   Cancer Mother     Current Outpatient Medications  Medication Sig Dispense Refill   losartan (COZAAR) 100 MG tablet Take 100 mg by mouth daily.     amLODipine (NORVASC) 10 MG tablet Take 1 tablet (10 mg total) by mouth daily. 30 tablet 0   aspirin EC 81 MG EC tablet Take 1 tablet (81 mg total) by mouth daily. Swallow whole. (Patient not taking: Reported on 03/07/2023) 30 tablet 11   atorvastatin (LIPITOR) 40 MG tablet Take 1 tablet (40 mg total) by mouth daily. 40 tablet 0   carvedilol (COREG) 3.125 MG tablet Take 1 tablet (3.125 mg total) by mouth 2 (two) times daily with a meal. 60 tablet 0   cilostazol (PLETAL) 100 MG tablet Take 100 mg by mouth 2 (two) times daily. (Patient not taking: Reported on 03/07/2023)     DULoxetine (CYMBALTA) 30 MG capsule  Take 1 capsule (30 mg total) by mouth daily. Follow with your PCP for adjustment for chronic pain 30 capsule 0   furosemide (LASIX) 20 MG tablet Take 20 mg by mouth daily. (Patient not taking: Reported on 03/07/2023)     nicotine (NICODERM CQ - DOSED IN MG/24 HOURS) 14 mg/24hr patch Place 1 patch (14 mg total) onto the skin daily. (Patient not taking: Reported on 03/07/2023) 28 patch 0   oxyCODONE-acetaminophen (PERCOCET/ROXICET) 5-325 MG tablet Take 1 tablet by mouth every 4 (four) hours as needed for severe pain. 30 tablet 0   polyethylene glycol (MIRALAX) 17 g packet Take 17 g by mouth daily. (Patient not taking: Reported on 03/07/2023) 14 each 0   potassium chloride (KLOR-CON) 10 MEQ tablet Take 10 mEq by mouth daily. (Patient not taking: Reported on 03/07/2023)     pregabalin (LYRICA) 25 MG capsule Take 1 capsule (25 mg total) by mouth at bedtime. Follow with your PCP for adjustment 30 capsule 0   No current facility-administered medications for this visit.    No Known Allergies   REVIEW OF SYSTEMS:  [X]  denotes positive finding, [ ]  denotes negative finding Cardiac  Comments:  Chest pain or chest pressure:    Shortness of breath upon exertion:    Short of breath when lying flat:    Irregular heart rhythm:        Vascular    Pain in calf, thigh, or hip brought on by ambulation:    Pain in feet at night that wakes you up from your sleep:     Blood clot in your veins:    Leg swelling:         Pulmonary    Oxygen at home:    Productive cough:     Wheezing:         Neurologic    Sudden weakness in arms or legs:     Sudden numbness in arms or legs:     Sudden onset of difficulty speaking or slurred speech:    Temporary loss of vision in one eye:     Problems with dizziness:         Gastrointestinal    Blood in stool:     Vomited blood:         Genitourinary    Burning when urinating:     Blood in urine:        Psychiatric    Major depression:         Hematologic     Bleeding problems:    Problems with blood clotting too easily:  Skin    Rashes or ulcers:        Constitutional    Fever or chills:      PHYSICAL EXAMINATION:  Vitals:   03/07/23 1413  BP: (!) 188/106  Pulse: 71  Temp: 98.3 F (36.8 C)  SpO2: 92%  Weight: 141 lb (64 kg)  Height: 5\' 2"  (1.575 m)    General:  WDWN in NAD; vital signs documented above Gait: Normal HENT: WNL, normocephalic Pulmonary: normal non-labored breathing , without  wheezing Cardiac: regular HR Abdomen: soft, NT, no masses. Laparotomy scar present Vascular Exam/Pulses: 2+ femoral pulses, 2+ right PT, 2+ left DP. Feet warm and well perfused Extremities: without ischemic changes, without Gangrene , without cellulitis; without open wounds Musculoskeletal: no muscle wasting or atrophy  Neurologic: A&O X 3 Psychiatric:  The pt has Normal affect.   Non-Invasive Vascular Imaging:   +-------+-----------+-----------+------------+------------+  ABI/TBIToday's ABIToday's TBIPrevious ABIPrevious TBI  +-------+-----------+-----------+------------+------------+  Right 0.64       0.59       0.48        0.36          +-------+-----------+-----------+------------+------------+  Left  1.03       0.88       0.83        0.54          +-------+-----------+-----------+------------+------------+  Monophasic waveforms on RLE Multiphasic waveforms on LLE   ASSESSMENT/PLAN:: 67 y.o. female here for surveillance follow up of PAD. She has recent history of aortobifemoral bypass by Dr. Randie Heinz on 06/21/21. Following this she had some complaints of bilateral lower extremity pain. She underwent a diagnostic Angiogram in May of 2023 which showed occluded right SFA with At and peroneal runoff. The left was patent throughout with three vessel runoff, with dominant AT. She is presently without any pain in her legs at rest or on ambulation. She has some continued numbness in both great toes. She has no tissue  loss.  - ABIs today are improved bilaterally - Continue statin and restart Aspirin 81 mg - Congratulated her on her continued smoking cessation  - Encourage walking regimen  - She will follow up again in 1 year with ABI. She knows to call sooner if she has any new or worsening symptoms   Graceann Congress, PA-C [facility] [phone]  Clinic MD:   Randie Heinz

## 2023-03-22 ENCOUNTER — Other Ambulatory Visit: Payer: Self-pay

## 2023-03-22 DIAGNOSIS — I739 Peripheral vascular disease, unspecified: Secondary | ICD-10-CM

## 2023-05-01 IMAGING — CT CT CTA ABD/PEL W/CM AND/OR W/O CM
2 of 6 series · 15 of 46 positions shown, 17 images · IV contrast (OMNI 350)
Comparison: CTA abdominal aorta with ileal femoral runoff
04/14/2021.

CLINICAL DATA: Recent fem-pop bypass graft.  Evaluate for leak.

EXAM:
CTA ABDOMEN AND PELVIS WITHOUT AND WITH CONTRAST
TECHNIQUE: Multidetector CT imaging of the abdomen and pelvis was performed
using the standard protocol during bolus administration of
intravenous contrast. Multiplanar reconstructed images and MIPs were
obtained and reviewed to evaluate the vascular anatomy.

[Series 7: dissection 2mm · axial · 0.81mm/px · z∈[+615,+1007]mm · 12 of 216 slices shown, 14 images]
[im 10/216  soft-tissue]
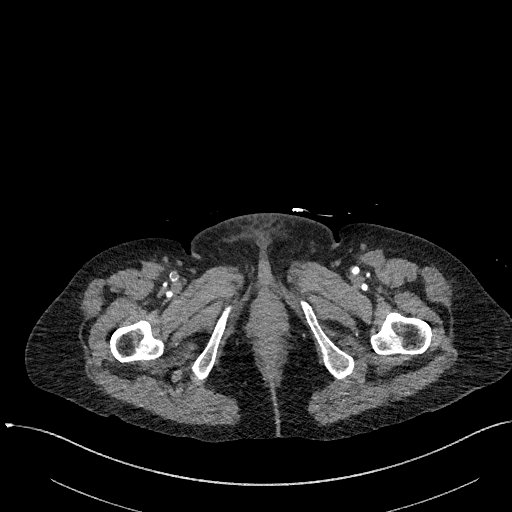
[im 10/216  bone]
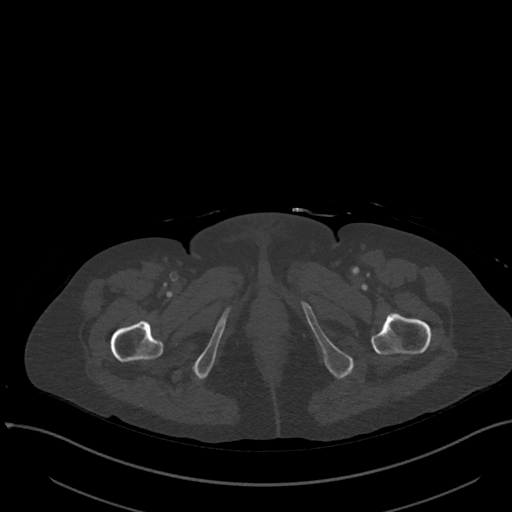
[im 30/216  soft-tissue]
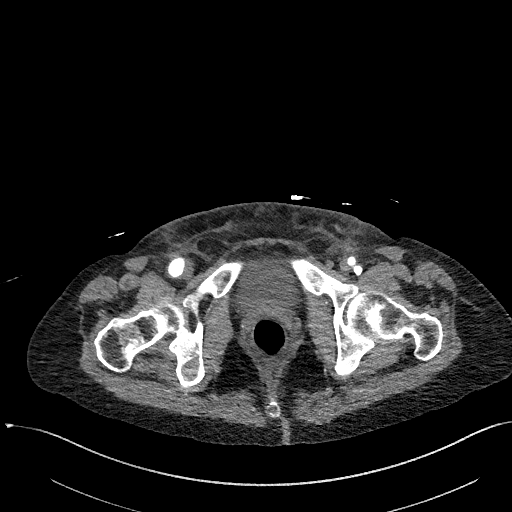
[im 49/216  soft-tissue]
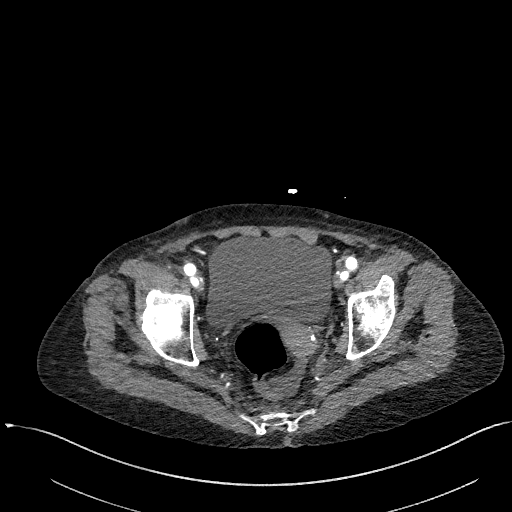
[im 69/216  soft-tissue]
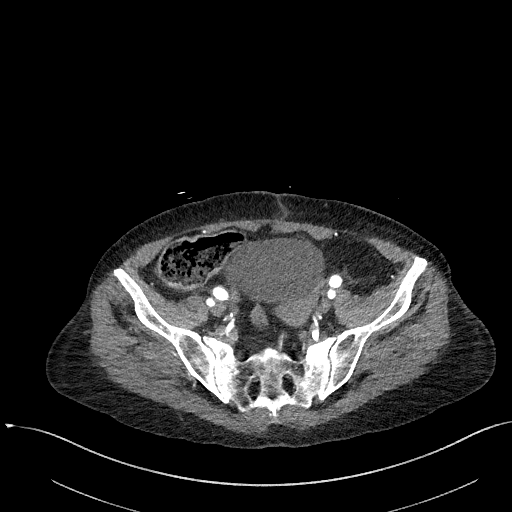
[im 79/216  soft-tissue]
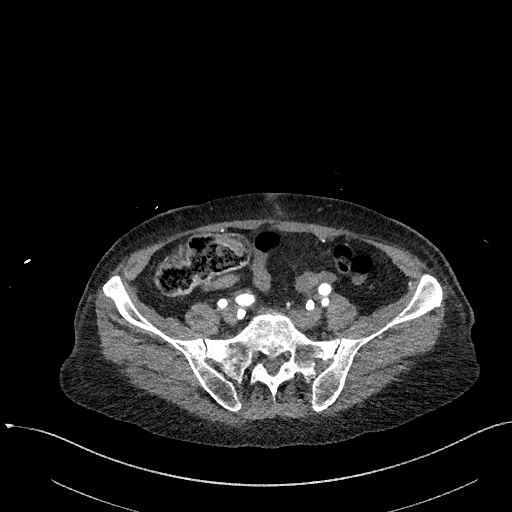
[im 98/216  soft-tissue]
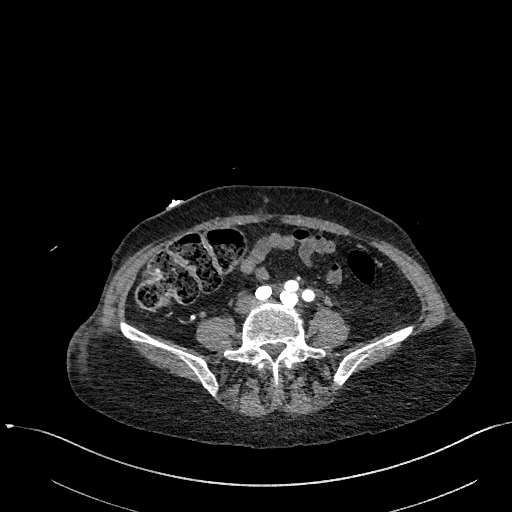
[im 118/216  soft-tissue]
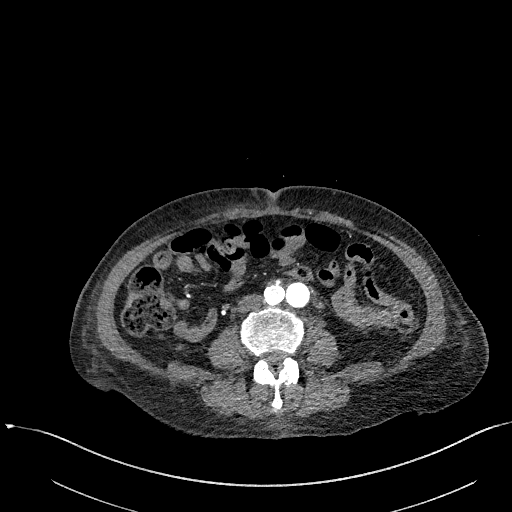
[im 137/216  soft-tissue]
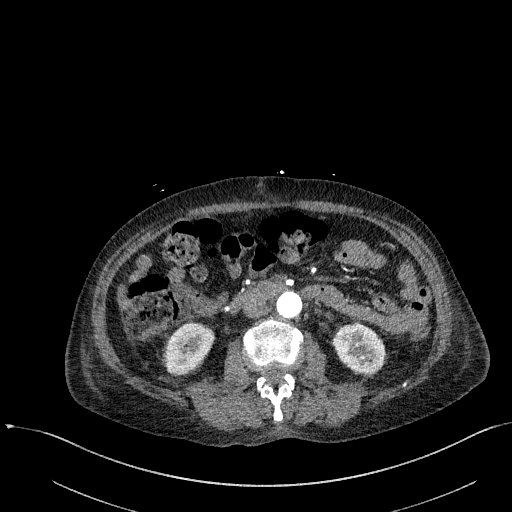
[im 147/216  soft-tissue]
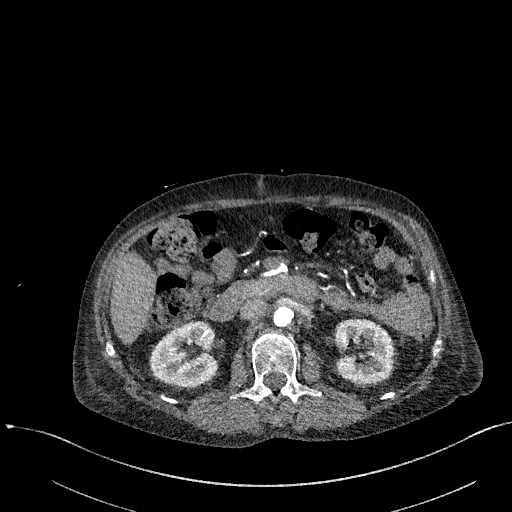
[im 147/216  bone]
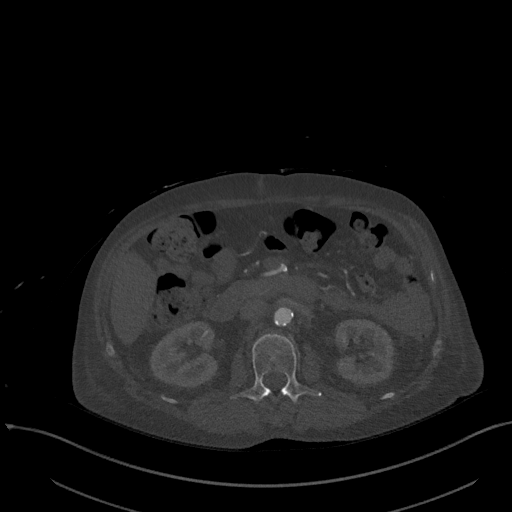
[im 167/216  soft-tissue]
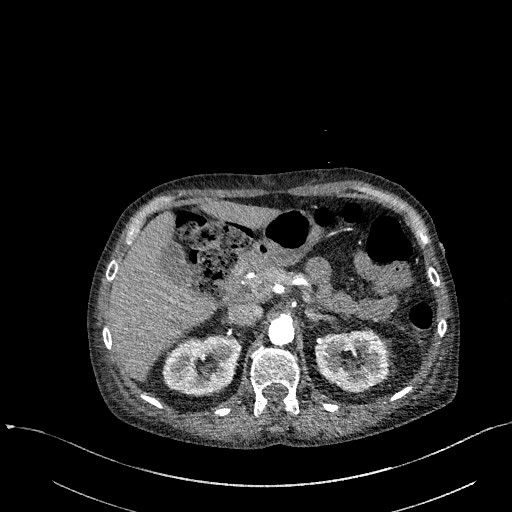
[im 186/216  soft-tissue]
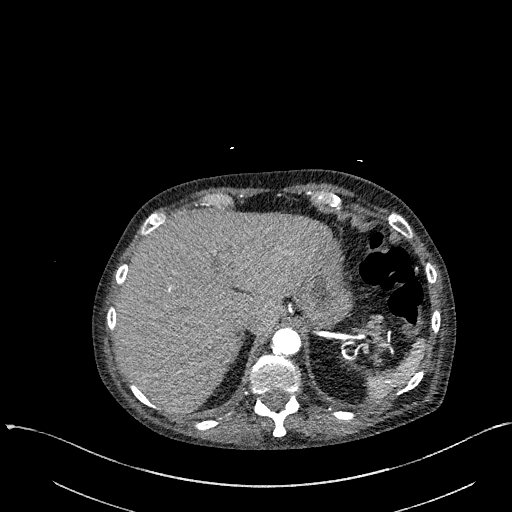
[im 206/216  soft-tissue]
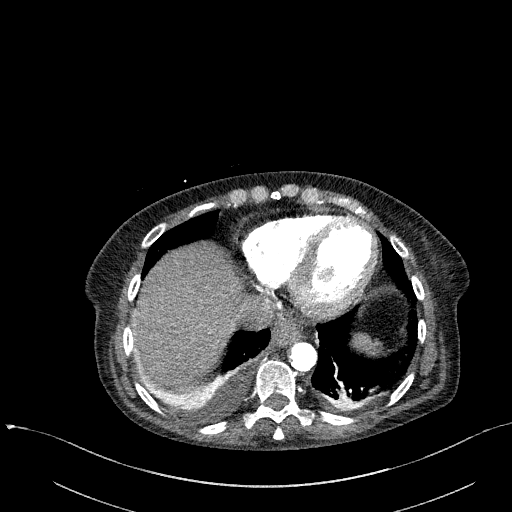

[Series 10: dissection 2mm cor · coronal · 0.65mm/px · 3 of 151 slices shown]
[im 38/151  soft-tissue]
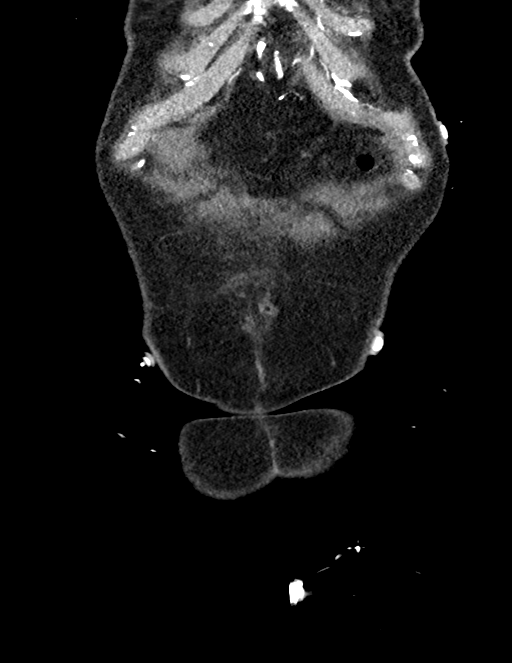
[im 76/151  soft-tissue]
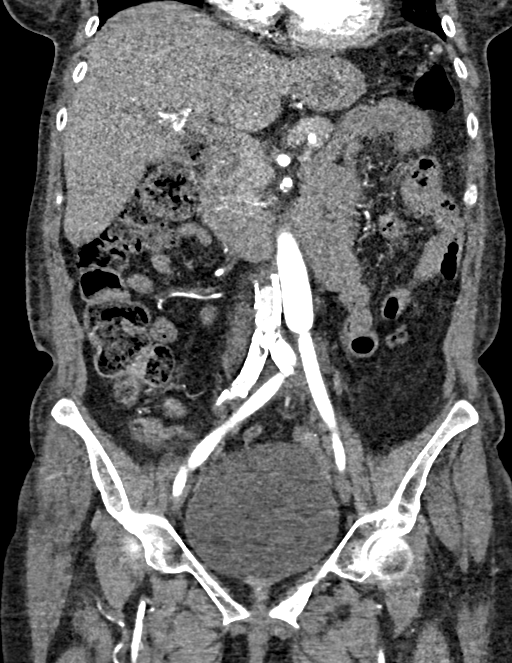
[im 113/151  soft-tissue]
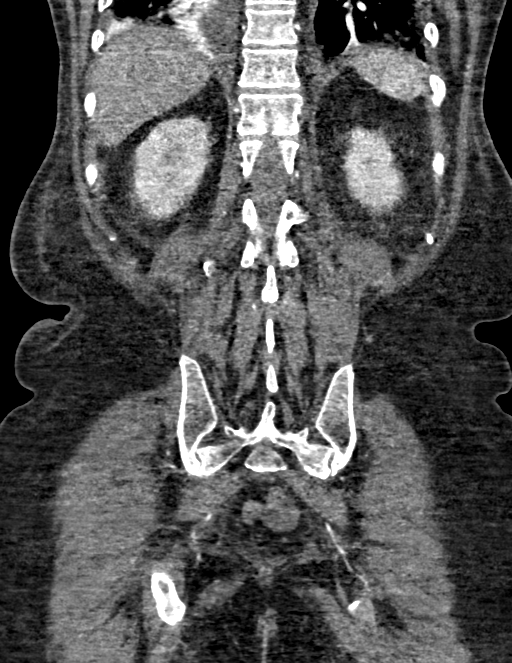

[15 of 46 positions shown; findings below may reference images not displayed]

RADIATION DOSE REDUCTION: This exam was performed according to the
departmental dose-optimization program which includes automated
exposure control, adjustment of the mA and/or kV according to
patient size and/or use of iterative reconstruction technique.

CONTRAST:  100mL OMNIPAQUE IOHEXOL 350 MG/ML SOLN
FINDINGS: VASCULAR

Aorta: Moderate calcified plaque throughout the abdominal aorta
which is normal in caliber. Interval aortobifemoral bypass graft
beginning below the renal arteries which is patent from takeoff to
touchdown site at the femoral arteries.

Celiac: Patent.

SMA: Moderate calcified plaque at the origin with mild to moderate
focal stenosis of the origin. No evidence of occlusion. Significant
calcified plaque 2.6 cm distal from the origin with mild associated
narrowing but no evidence of occlusion.

Renals: Calcified plaque at the origin of the renal arteries
bilaterally with mild narrowing at the origin bilaterally. Renal
arteries otherwise patent with normal opacification.

IMA: Patent as the IMA fills from the native aorta.

Inflow: Normal via patent aortobifemoral graft. Calcified plaque
over the native internal iliac arteries which are patent.

Proximal Outflow: Patent touchdown site of the aortobifemoral bypass
graft. Calcified plaque over the proximal femoral arteries distal to
the bifurcation with calcified plaque also over the profundus
femoral arteries bilaterally. There is complete occlusion of the
right femoral artery 1.5 cm distal to the bifurcation.

Veins: Unremarkable.

Review of the MIP images confirms the above findings.

NON-VASCULAR

Lower chest: Lung bases demonstrate small bilateral effusions right
greater than left with associated compressive atelectasis. Mild
cardiomegaly. Calcified plaque over the descending thoracic aorta.

Hepatobiliary: Liver and biliary tree are within normal. Gallbladder
is unremarkable.

Pancreas: Normal.

Spleen: Normal.

Adrenals/Urinary Tract: Adrenal glands are normal. Kidneys are
normal in size without hydronephrosis or nephrolithiasis. Ureters
and bladder are normal.

Stomach/Bowel: Stomach and small bowel are normal. Appendix is
normal. Mild fecal retention throughout the colon.

Lymphatic: No adenopathy.

Reproductive: Normal.

Other: Minimal free fluid over the left pelvis.

Musculoskeletal: No focal abnormality. Old left superior pubic ramus
fracture towards the symphysis.
IMPRESSION: 1. Interval aortobifemoral bypass graft which is patent from takeoff
to touchdown site at the femoral arteries.
2. Complete occlusion of the right femoral artery 1.5 cm distal to
the bifurcation. Consider further evaluation with CTA pelvis/runoff.
3. Mild to moderate focal stenosis of the origin of the superior
mesenteric artery. Mild narrowing of the bilateral renal arteries.
4. Small bilateral effusions right greater than left with associated
compressive atelectasis.

Aortic Atherosclerosis (8049T-EUO.O).

## 2023-05-04 IMAGING — DX DG CHEST 1V PORT
1 series · 1 of 1 positions shown · non-contrast
Comparison: 09/10/2021

CLINICAL DATA: Pleural effusion

EXAM:
PORTABLE CHEST 1 VIEW

[chest]
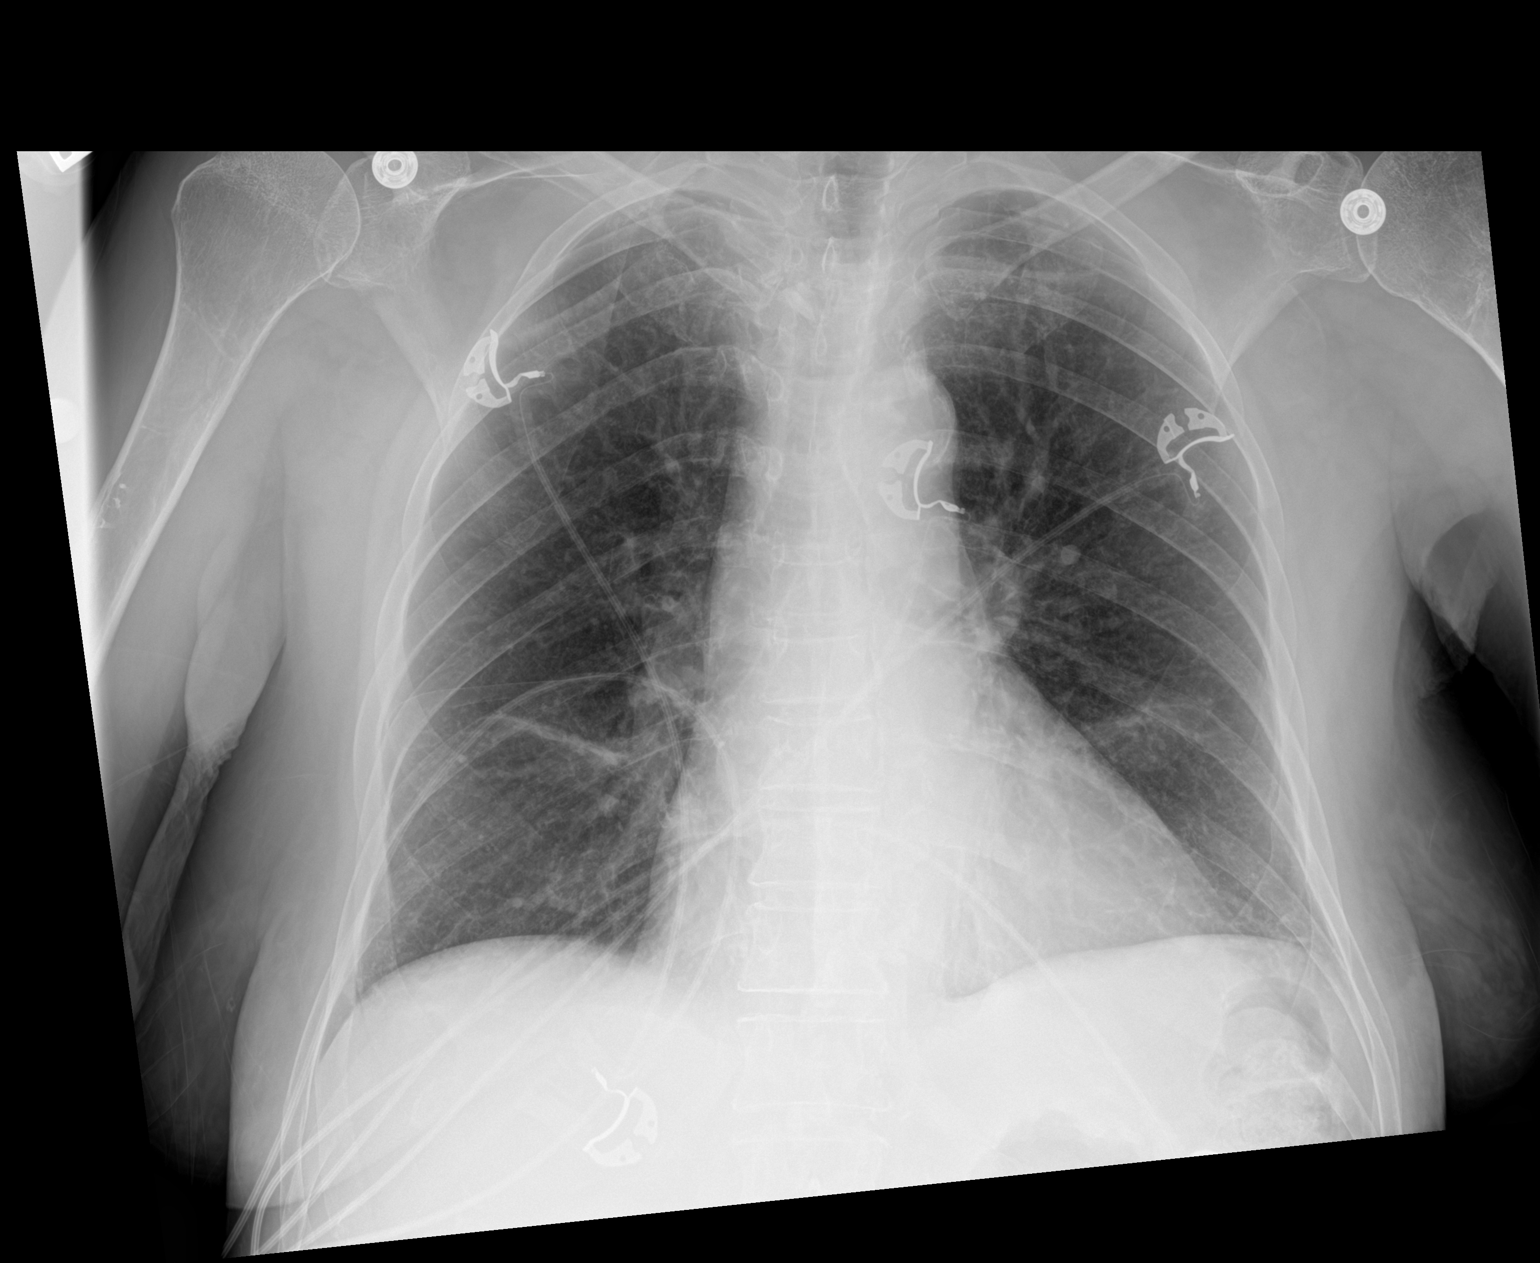

[1 of 1 positions shown; findings below may reference images not displayed]

FINDINGS: Heart size upper normal. Vascularity normal. Small right pleural
effusion on the prior study has improved. No left effusion. Mild
atelectasis in the lung bases.

New line in the right upper lobe with fading margin suggesting skin
fold. Pneumothorax less likely.
IMPRESSION: Mild bibasilar atelectasis. Improvement in small right pleural
effusion.

Line in the right upper lobe is new. This may represent a skin fold
or less likely pneumothorax. Follow-up recommended.

## 2023-10-25 NOTE — Progress Notes (Unsigned)
 Cardiology Office Note:    Date:  10/26/2023   ID:  Kathryn Fowler, DOB 07/06/1955, MRN [id number]  PCP:  Aloha Arnold, PA-C   [facility] Providers Cardiologist:    Nikisha Fleece     Referring MD: Aloha Arnold, PA-C   Chief Complaint  Patient presents with   PAD   Hyperlipidemia    History of Present Illness:    Kathryn Fowler is a 68 y.o. female with a hx of peripheral arterieal disease  + smoker, HTN No CP , just clauditation  Still smokes  - we discussed cessation  Hx of HTN  Eats salty foods on a regular basis  Eats bacon weekly, bologna, and ham fairly often  Works for [employer]     October 26, 2023 Kathryn Fowler is seen for follow up of her PAD , HLD  Still eating salty foods  Is eating cereal for breakfast      Past Medical History:  Diagnosis Date   Arthritis    B12 deficiency    Carotid artery stenosis    1-39% BICA stenosis 04/20/21 [place]    Dyspnea    Esophageal reflux    History of tobacco abuse    Hyperlipidemia    Hypertension    Hyponatremia    Osteoporosis    PAD (peripheral artery disease) (HCC)    Polycythemia     Past Surgical History:  Procedure Laterality Date   AORTA - BILATERAL FEMORAL ARTERY BYPASS GRAFT N/A 06/21/2021   Procedure: AORTOBIFEMORAL BYPASS GRAFT;  Surgeon: Adine Hoof, MD;  Location: [facility] OR;  Service: Vascular;  Laterality: N/A;   LOWER EXTREMITY ANGIOGRAPHY N/A 09/12/2021   Procedure: Lower Extremity Angiography;  Surgeon: Adine Hoof, MD;  Location: [facility] INVASIVE CV LAB;  Service: Cardiovascular;  Laterality: N/A;    Current Medications: Current Meds  Medication Sig   aspirin  EC 81 MG EC tablet Take 1 tablet (81 mg total) by mouth daily. Swallow whole.   atorvastatin  (LIPITOR) 40 MG tablet Take 1 tablet (40 mg total) by mouth daily.   hydrochlorothiazide (HYDRODIURIL) 25 MG tablet Take 25 mg by mouth daily.   losartan  (COZAAR ) 100 MG tablet Take 100 mg by mouth daily.   metoprolol   succinate (TOPROL -XL) 100 MG 24 hr tablet Take 150 mg by mouth daily.   polyethylene glycol (MIRALAX ) 17 g packet Take 17 g by mouth daily.     Allergies:   Patient has no known allergies.   Social History   Socioeconomic History   Marital status: Married    Spouse name: Not on file   Number of children: Not on file   Years of education: Not on file   Highest education level: Not on file  Occupational History   Not on file  Tobacco Use   Smoking status: Former    Current packs/day: 0.00    Types: Cigarettes    Quit date: 06/2021    Years since quitting: 2.3    Passive exposure: Never   Smokeless tobacco: Not on file  Vaping Use   Vaping status: Never Used  Substance and Sexual Activity   Alcohol use: Yes    Alcohol/week: 28.0 standard drinks of alcohol    Types: 28 Cans of beer per week   Drug use: Yes    Types: Marijuana   Sexual activity: Not on file  Other Topics Concern   Not on file  Social History Narrative   Not on file   Social Drivers of Health  Financial Resource Strain: Not on file  Food Insecurity: Not on file  Transportation Needs: Not on file  Physical Activity: Not on file  Stress: Not on file  Social Connections: Not on file     Family History: The patient's family history includes Cancer in her mother.  ROS:   Please see the history of present illness.     All other systems reviewed and are negative.  EKGs/Labs/Other Studies Reviewed:    The following studies were reviewed today:   EKG:   EKG Interpretation Date/Time:  Friday October 26 2023 09:03:29 EDT Ventricular Rate:  64 PR Interval:  176 QRS Duration:  84 QT Interval:  384 QTC Calculation: 396 R Axis:   5  Text Interpretation: Normal sinus rhythm Normal ECG When compared with ECG of 10-Sep-2021 08:38, QT has shortened Confirmed by Ahmad Alert ([id number]) on 10/26/2023 9:17:33 AM    Recent Labs: No results found for requested labs within last 365 days.  Recent Lipid Panel     Component Value Date/Time   CHOL 249 (H) 09/10/2021 1653   TRIG 65 09/10/2021 1653   HDL 54 09/10/2021 1653   CHOLHDL 4.6 09/10/2021 1653   VLDL 13 09/10/2021 1653   LDLCALC 182 (H) 09/10/2021 1653     Risk Assessment/Calculations:           Physical Exam:    Physical Exam: Blood pressure (!) 166/84, pulse 71, height 5' 2 (1.575 m), weight 147 lb 12.8 oz (67 kg), SpO2 97%.  BP is elevated  because she has not had her BP meds    GEN:  Well nourished, well developed in no acute distress HEENT: Normal NECK: No JVD; No carotid bruits LYMPHATICS: No lymphadenopathy CARDIAC: RRR , no murmurs, rubs, gallops RESPIRATORY:  Clear to auscultation without rales, wheezing or rhonchi  ABDOMEN: Soft, non-tender, non-distended MUSCULOSKELETAL:  No edema; No deformity  SKIN: Warm and dry NEUROLOGIC:  Alert and oriented x 3   ASSESSMENT:    1. PAD (peripheral artery disease) (HCC)   2. Mixed hyperlipidemia     PLAN:       Peripheral arterial disease:    Will continue to follow up with Dr. Vikki Graves    2.  HLD :   cont atrova.  Check lipids/ ALT today      Shared Decision Making/Informed Consent{     Medication Adjustments/Labs and Tests Ordered: Current medicines are reviewed at length with the patient today.  Concerns regarding medicines are outlined above.  Orders Placed This Encounter  Procedures   Lipid panel   ALT   EKG 12-Lead   No orders of the defined types were placed in this encounter.    Patient Instructions  Lab Work: Lipids today If you have labs (blood work) drawn today and your tests are completely normal, you will receive your results only by: MyChart Message (if you have MyChart) OR A paper copy in the mail If you have any lab test that is abnormal or we need to change your treatment, we will call you to review the results.  Follow-Up: At [facility], you and your health needs are our priority.  As part of our continuing mission to  provide you with exceptional heart care, our providers are all part of one team.  This team includes your primary Cardiologist (physician) and Advanced Practice Providers or APPs (Physician Assistants and Nurse Practitioners) who all work together to provide you with the care you need, when you need it.  Your next appointment:   1 year(s)  Provider:   Ahmad Alert, MD        Signed, Ahmad Alert, MD  10/26/2023 9:23 AM    [facility]

## 2023-10-26 ENCOUNTER — Ambulatory Visit: Attending: Cardiovascular Disease | Admitting: Cardiovascular Disease

## 2023-10-26 ENCOUNTER — Encounter: Payer: Self-pay | Admitting: Cardiovascular Disease

## 2023-10-26 VITALS — BP 166/84 | HR 71 | Ht 62.0 in | Wt 147.8 lb

## 2023-10-26 DIAGNOSIS — E782 Mixed hyperlipidemia: Secondary | ICD-10-CM | POA: Diagnosis not present

## 2023-10-26 DIAGNOSIS — I739 Peripheral vascular disease, unspecified: Secondary | ICD-10-CM

## 2023-10-26 NOTE — Patient Instructions (Signed)
 Lab Work: Lipids today If you have labs (blood work) drawn today and your tests are completely normal, you will receive your results only by: MyChart Message (if you have MyChart) OR A paper copy in the mail If you have any lab test that is abnormal or we need to change your treatment, we will call you to review the results.  Follow-Up: At [facility], you and your health needs are our priority.  As part of our continuing mission to provide you with exceptional heart care, our providers are all part of one team.  This team includes your primary Cardiologist (physician) and Advanced Practice Providers or APPs (Physician Assistants and Nurse Practitioners) who all work together to provide you with the care you need, when you need it.  Your next appointment:   1 year(s)  Provider:   Ahmad Alert, MD

## 2023-10-27 LAB — LIPID PANEL
Chol/HDL Ratio: 2.5 ratio (ref 0.0–4.4)
Cholesterol, Total: 174 mg/dL (ref 100–199)
HDL: 70 mg/dL (ref >39)
LDL Chol Calc (NIH): 90 mg/dL (ref 0–99)
Triglycerides: 73 mg/dL (ref 0–149)
VLDL Cholesterol Cal: 14 mg/dL (ref 5–40)

## 2023-10-27 LAB — ALT: ALT: 19 IU/L (ref 0–32)

## 2023-10-28 ENCOUNTER — Ambulatory Visit: Payer: Self-pay | Admitting: Cardiovascular Disease

## 2023-10-28 DIAGNOSIS — E782 Mixed hyperlipidemia: Secondary | ICD-10-CM

## 2023-10-29 MED ORDER — EZETIMIBE 10 MG PO TABS: 10.0000 mg | ORAL_TABLET | Freq: Every day | ORAL | 3 refills | Status: AC

## 2023-10-29 NOTE — Telephone Encounter (Signed)
 Called and spoke with patient who agrees to plan. Medication sent to [facility] per request. She will be going to PCP in October, and will plan to have them draw her repeat labs and send results to us .

## 2023-10-29 NOTE — Telephone Encounter (Signed)
-----   Message from Aleene Passe sent at 10/28/2023  6:08 AM EDT ----- ALT is normal  She has PAD , her LDL goal is < 70 Current LDL is 90 on Atorvastatin  40 mg a day  Add Zetia 10 mg a day  Check lipids in 3 months   ----- Message ----- From: Interface, [facility] Lab Results In Sent: 10/27/2023   2:36 AM EDT To: Aleene JINNY Passe, MD

## 2024-02-12 LAB — COLOGUARD: COLOGUARD: NEGATIVE
